# Patient Record
Sex: Male | Born: 1945
Health system: Southern US, Community
[De-identification: ages and names within clinical notes are randomized; demographics above are authoritative.]

## PROBLEM LIST (undated history)

## (undated) DIAGNOSIS — E785 Hyperlipidemia, unspecified: Secondary | ICD-10-CM

## (undated) DIAGNOSIS — Z87442 Personal history of urinary calculi: Secondary | ICD-10-CM

## (undated) DIAGNOSIS — I10 Essential (primary) hypertension: Secondary | ICD-10-CM

## (undated) DIAGNOSIS — S065X9A Traumatic subdural hemorrhage with loss of consciousness of unspecified duration, initial encounter: Secondary | ICD-10-CM

## (undated) DIAGNOSIS — I2510 Atherosclerotic heart disease of native coronary artery without angina pectoris: Secondary | ICD-10-CM

## (undated) DIAGNOSIS — I219 Acute myocardial infarction, unspecified: Secondary | ICD-10-CM

## (undated) HISTORY — DX: Hyperlipidemia, unspecified: E78.5

## (undated) HISTORY — DX: Atherosclerotic heart disease of native coronary artery without angina pectoris: I25.10

## (undated) HISTORY — PX: CHOLECYSTECTOMY OPEN: SUR202

## (undated) HISTORY — PX: APPENDECTOMY: SHX54

---

## 1991-06-11 DIAGNOSIS — I219 Acute myocardial infarction, unspecified: Secondary | ICD-10-CM

## 1991-06-11 HISTORY — DX: Acute myocardial infarction, unspecified: I21.9

## 1991-06-11 HISTORY — PX: CORONARY ANGIOPLASTY WITH STENT PLACEMENT: SHX49

## 2001-06-22 ENCOUNTER — Encounter: Payer: Self-pay | Admitting: Emergency Medicine

## 2001-06-22 ENCOUNTER — Inpatient Hospital Stay (HOSPITAL_COMMUNITY): Admission: EM | Admit: 2001-06-22 | Discharge: 2001-06-26 | Payer: Self-pay | Admitting: Emergency Medicine

## 2001-06-22 ENCOUNTER — Encounter: Payer: Self-pay | Admitting: Cardiology

## 2001-06-23 ENCOUNTER — Encounter: Payer: Self-pay | Admitting: Cardiology

## 2001-06-25 ENCOUNTER — Encounter: Payer: Self-pay | Admitting: Emergency Medicine

## 2004-04-13 ENCOUNTER — Ambulatory Visit: Payer: Self-pay | Admitting: Cardiology

## 2004-05-08 ENCOUNTER — Ambulatory Visit: Payer: Self-pay | Admitting: Cardiology

## 2004-06-18 ENCOUNTER — Ambulatory Visit: Payer: Self-pay | Admitting: Cardiology

## 2004-08-20 ENCOUNTER — Ambulatory Visit: Payer: Self-pay | Admitting: Cardiology

## 2005-04-11 ENCOUNTER — Ambulatory Visit: Payer: Self-pay | Admitting: Cardiology

## 2006-05-19 ENCOUNTER — Ambulatory Visit: Payer: Self-pay | Admitting: Cardiology

## 2006-06-05 ENCOUNTER — Ambulatory Visit: Payer: Self-pay

## 2006-10-27 ENCOUNTER — Ambulatory Visit: Payer: Self-pay | Admitting: Cardiology

## 2006-10-27 LAB — CONVERTED CEMR LAB
ALT: 12 units/L (ref 0–40)
AST: 20 units/L (ref 0–37)
Albumin: 3.7 g/dL (ref 3.5–5.2)
Alkaline Phosphatase: 58 units/L (ref 39–117)
Bilirubin, Direct: 0.1 mg/dL (ref 0.0–0.3)
Cholesterol: 129 mg/dL (ref 0–200)
HDL: 37.2 mg/dL — ABNORMAL LOW (ref >39.0)
LDL Cholesterol: 73 mg/dL (ref 0–99)
Total Bilirubin: 0.7 mg/dL (ref 0.3–1.2)
Total CHOL/HDL Ratio: 3.5
Total Protein: 6.4 g/dL (ref 6.0–8.3)
Triglycerides: 93 mg/dL (ref 0–149)
VLDL: 19 mg/dL (ref 0–40)

## 2007-06-10 ENCOUNTER — Ambulatory Visit: Payer: Self-pay | Admitting: Cardiology

## 2007-06-17 ENCOUNTER — Ambulatory Visit: Payer: Self-pay | Admitting: Cardiology

## 2007-06-17 LAB — CONVERTED CEMR LAB
ALT: 15 units/L (ref 0–53)
AST: 23 units/L (ref 0–37)
Albumin: 4 g/dL (ref 3.5–5.2)
Alkaline Phosphatase: 67 units/L (ref 39–117)
BUN: 11 mg/dL (ref 6–23)
Basophils Absolute: 0 10*3/uL (ref 0.0–0.1)
Basophils Relative: 0.1 % (ref 0.0–1.0)
Bilirubin, Direct: 0.1 mg/dL (ref 0.0–0.3)
CO2: 29 meq/L (ref 19–32)
Calcium: 9.4 mg/dL (ref 8.4–10.5)
Chloride: 105 meq/L (ref 96–112)
Cholesterol: 144 mg/dL (ref 0–200)
Creatinine, Ser: 1.2 mg/dL (ref 0.4–1.5)
Eosinophils Absolute: 0.1 10*3/uL (ref 0.0–0.6)
Eosinophils Relative: 1.8 % (ref 0.0–5.0)
GFR calc Af Amer: 79 mL/min
GFR calc non Af Amer: 65 mL/min
Glucose, Bld: 105 mg/dL — ABNORMAL HIGH (ref 70–99)
HCT: 44.3 % (ref 39.0–52.0)
HDL: 36.8 mg/dL — ABNORMAL LOW (ref >39.0)
Hemoglobin: 15.4 g/dL (ref 13.0–17.0)
LDL Cholesterol: 85 mg/dL (ref 0–99)
Lymphocytes Relative: 26.5 % (ref 12.0–46.0)
MCHC: 34.8 g/dL (ref 30.0–36.0)
MCV: 90.2 fL (ref 78.0–100.0)
Monocytes Absolute: 0.4 10*3/uL (ref 0.2–0.7)
Monocytes Relative: 8.2 % (ref 3.0–11.0)
Neutro Abs: 3.5 10*3/uL (ref 1.4–7.7)
Neutrophils Relative %: 63.4 % (ref 43.0–77.0)
Platelets: 205 10*3/uL (ref 150–400)
Potassium: 4.5 meq/L (ref 3.5–5.1)
RBC: 4.91 M/uL (ref 4.22–5.81)
RDW: 11.8 % (ref 11.5–14.6)
Sodium: 141 meq/L (ref 135–145)
TSH: 2.92 microintl units/mL (ref 0.35–5.50)
Total Bilirubin: 0.8 mg/dL (ref 0.3–1.2)
Total CHOL/HDL Ratio: 3.9
Total Protein: 6.7 g/dL (ref 6.0–8.3)
Triglycerides: 109 mg/dL (ref 0–149)
VLDL: 22 mg/dL (ref 0–40)
WBC: 5.4 10*3/uL (ref 4.5–10.5)

## 2008-05-11 ENCOUNTER — Ambulatory Visit: Payer: Self-pay | Admitting: Cardiology

## 2008-05-18 ENCOUNTER — Ambulatory Visit: Payer: Self-pay | Admitting: Cardiology

## 2008-05-18 LAB — CONVERTED CEMR LAB
ALT: 15 units/L (ref 0–53)
AST: 21 units/L (ref 0–37)
Basophils Relative: 0.9 % (ref 0.0–3.0)
CO2: 30 meq/L (ref 19–32)
Calcium: 9.2 mg/dL (ref 8.4–10.5)
Chloride: 108 meq/L (ref 96–112)
Cholesterol: 124 mg/dL (ref 0–200)
Creatinine, Ser: 1.2 mg/dL (ref 0.4–1.5)
Eosinophils Relative: 2.3 % (ref 0.0–5.0)
Glucose, Bld: 111 mg/dL — ABNORMAL HIGH (ref 70–99)
Hemoglobin: 15.2 g/dL (ref 13.0–17.0)
LDL Cholesterol: 66 mg/dL (ref 0–99)
Lymphocytes Relative: 27.6 % (ref 12.0–46.0)
Monocytes Relative: 8.1 % (ref 3.0–12.0)
Neutro Abs: 3.9 10*3/uL (ref 1.4–7.7)
Neutrophils Relative %: 61.1 % (ref 43.0–77.0)
RBC: 4.81 M/uL (ref 4.22–5.81)
Total CHOL/HDL Ratio: 3.9
Total Protein: 6.7 g/dL (ref 6.0–8.3)
WBC: 6.3 10*3/uL (ref 4.5–10.5)

## 2008-09-22 ENCOUNTER — Telehealth: Payer: Self-pay | Admitting: Cardiology

## 2009-05-11 DIAGNOSIS — I2510 Atherosclerotic heart disease of native coronary artery without angina pectoris: Secondary | ICD-10-CM

## 2009-05-11 DIAGNOSIS — E785 Hyperlipidemia, unspecified: Secondary | ICD-10-CM

## 2009-05-12 ENCOUNTER — Ambulatory Visit: Payer: Self-pay | Admitting: Cardiology

## 2009-05-23 ENCOUNTER — Ambulatory Visit: Payer: Self-pay | Admitting: Cardiology

## 2009-05-31 ENCOUNTER — Telehealth: Payer: Self-pay | Admitting: Cardiology

## 2009-06-06 ENCOUNTER — Encounter (INDEPENDENT_AMBULATORY_CARE_PROVIDER_SITE_OTHER): Payer: Self-pay | Admitting: *Deleted

## 2009-06-06 LAB — CONVERTED CEMR LAB
AST: 23 units/L (ref 0–37)
Albumin: 3.7 g/dL (ref 3.5–5.2)
Alkaline Phosphatase: 65 units/L (ref 39–117)
BUN: 11 mg/dL (ref 6–23)
Basophils Absolute: 0 10*3/uL (ref 0.0–0.1)
Bilirubin, Direct: 0.2 mg/dL (ref 0.0–0.3)
CO2: 29 meq/L (ref 19–32)
Calcium: 9.1 mg/dL (ref 8.4–10.5)
Chloride: 105 meq/L (ref 96–112)
Cholesterol: 112 mg/dL (ref 0–200)
Creatinine, Ser: 1.1 mg/dL (ref 0.4–1.5)
Eosinophils Absolute: 0.1 10*3/uL (ref 0.0–0.7)
LDL Cholesterol: 58 mg/dL (ref 0–99)
Lymphocytes Relative: 28.1 % (ref 12.0–46.0)
MCHC: 35.1 g/dL (ref 30.0–36.0)
MCV: 92.1 fL (ref 78.0–100.0)
Monocytes Absolute: 0.5 10*3/uL (ref 0.1–1.0)
Neutrophils Relative %: 62.5 % (ref 43.0–77.0)
Platelets: 188 10*3/uL (ref 150.0–400.0)
RBC: 4.67 M/uL (ref 4.22–5.81)
Total CHOL/HDL Ratio: 4
Total Protein: 6.7 g/dL (ref 6.0–8.3)
Triglycerides: 116 mg/dL (ref 0.0–149.0)

## 2009-07-06 ENCOUNTER — Telehealth: Payer: Self-pay | Admitting: Cardiology

## 2009-10-24 ENCOUNTER — Telehealth: Payer: Self-pay | Admitting: Cardiology

## 2010-02-05 ENCOUNTER — Telehealth: Payer: Self-pay | Admitting: Cardiology

## 2010-02-09 ENCOUNTER — Encounter (INDEPENDENT_AMBULATORY_CARE_PROVIDER_SITE_OTHER): Payer: Self-pay | Admitting: *Deleted

## 2010-03-21 ENCOUNTER — Telehealth: Payer: Self-pay | Admitting: Cardiology

## 2010-05-21 ENCOUNTER — Encounter: Payer: Self-pay | Admitting: Cardiology

## 2010-05-21 ENCOUNTER — Ambulatory Visit: Payer: Self-pay | Admitting: Cardiology

## 2010-05-21 DIAGNOSIS — R079 Chest pain, unspecified: Secondary | ICD-10-CM

## 2010-05-29 ENCOUNTER — Telehealth (INDEPENDENT_AMBULATORY_CARE_PROVIDER_SITE_OTHER): Payer: Self-pay | Admitting: *Deleted

## 2010-05-30 ENCOUNTER — Encounter: Payer: Self-pay | Admitting: Internal Medicine

## 2010-05-30 ENCOUNTER — Encounter (HOSPITAL_COMMUNITY)
Admission: RE | Admit: 2010-05-30 | Discharge: 2010-07-10 | Payer: Self-pay | Source: Home / Self Care | Attending: Cardiology | Admitting: Cardiology

## 2010-05-30 ENCOUNTER — Ambulatory Visit: Payer: Self-pay | Admitting: Cardiology

## 2010-05-30 ENCOUNTER — Ambulatory Visit: Payer: Self-pay

## 2010-06-06 LAB — CONVERTED CEMR LAB
ALT: 28 units/L (ref 0–53)
AST: 31 units/L (ref 0–37)
Albumin: 3.8 g/dL (ref 3.5–5.2)
Alkaline Phosphatase: 68 units/L (ref 39–117)
Basophils Absolute: 0 10*3/uL (ref 0.0–0.1)
Basophils Relative: 0.4 % (ref 0.0–3.0)
CO2: 33 meq/L — ABNORMAL HIGH (ref 19–32)
Calcium: 9.4 mg/dL (ref 8.4–10.5)
Cholesterol: 160 mg/dL (ref 0–200)
Creatinine, Ser: 1.1 mg/dL (ref 0.4–1.5)
Eosinophils Absolute: 0.1 10*3/uL (ref 0.0–0.7)
GFR calc non Af Amer: 69.95 mL/min (ref >60.00)
HDL: 40.1 mg/dL (ref >39.00)
Lymphocytes Relative: 24.8 % (ref 12.0–46.0)
MCHC: 34.7 g/dL (ref 30.0–36.0)
Monocytes Relative: 8.3 % (ref 3.0–12.0)
Neutrophils Relative %: 64.7 % (ref 43.0–77.0)
RBC: 4.78 M/uL (ref 4.22–5.81)
Sodium: 144 meq/L (ref 135–145)
Total CHOL/HDL Ratio: 4
Total Protein: 6.5 g/dL (ref 6.0–8.3)
Triglycerides: 133 mg/dL (ref 0.0–149.0)

## 2010-06-13 ENCOUNTER — Other Ambulatory Visit: Payer: Self-pay | Admitting: Cardiology

## 2010-06-13 ENCOUNTER — Ambulatory Visit: Admission: RE | Admit: 2010-06-13 | Discharge: 2010-06-13 | Payer: Self-pay | Source: Home / Self Care

## 2010-06-14 LAB — BASIC METABOLIC PANEL
BUN: 14 mg/dL (ref 6–23)
CO2: 27 mEq/L (ref 19–32)
Calcium: 9.4 mg/dL (ref 8.4–10.5)
Chloride: 106 mEq/L (ref 96–112)
Creatinine, Ser: 1.4 mg/dL (ref 0.4–1.5)
GFR: 55.43 mL/min — ABNORMAL LOW (ref >60.00)
Glucose, Bld: 98 mg/dL (ref 70–99)
Potassium: 4.4 mEq/L (ref 3.5–5.1)
Sodium: 144 mEq/L (ref 135–145)

## 2010-07-10 NOTE — Letter (Signed)
Summary: Generic Letter  Architectural technologist, Main Office  [address]   [place], [place] [address]   Phone: [phone]  Fax: [phone]        February 09, 2010 MRN: [id number]    Jeffrey Lyons [address] [place], [place]  [address]    Dear Jeffrey Lyons,  I have attempted to contact you at the home phone number we have on file. When calling, it states this number has been disconnected. I just wanted to let you know that I spoke with Dr. Juanda Chance about your cardizem. He said it is ok to take the short acting tablet once daily until you run out of your current suppley. After that, we will need to refill the cardizem cd 120mg  capsules for you. If you develop any chest pain prior to running out of your current prescription, let us know.        Sincerely,  Sherri Rad, RN, BSN  This letter has been electronically signed by your physician.

## 2010-07-10 NOTE — Progress Notes (Signed)
Summary: test result send to home address [facility])   Phone Note Call from Patient Call back at [facility] Phone [phone] Call back at [phone]   Caller: Patient Reason for Call: Talk to Nurse, Lab or Test Results Summary of Call: send copy of test result to home address.  Initial call taken by: Lorne Skeens,  March 21, 2010 1:26 PM  Follow-up for Phone Call        I attempted to call the pt. He has not had any recent testing in our office. His contact # states it is disconnected. I looked in [employer] and found a different contact #. I have left a message at [phone] to call.  Follow-up by: Sherri Rad, RN, BSN,  March 21, 2010 1:37 PM  Additional Follow-up for Phone Call Additional follow up Details #1::        Pt calling for test results Judie Grieve  March 23, 2010 10:17 AM  spoke w/pts wife would like copy of lab results mailed to him, advised last results were from Dec, he just needs them for his job/ins. will mail Meredith Staggers, RN  March 23, 2010 10:41 AM

## 2010-07-10 NOTE — Progress Notes (Signed)
Summary: pt needs nitro done daj  Medications Added METOPROLOL TARTRATE 50 MG TABS (METOPROLOL TARTRATE) Take one tablet by mouth twice a day NITROSTAT 0.4 MG SUBL (NITROGLYCERIN) 1 tablet under tongue at onset of chest pain; you may repeat every 5 minutes for up to 3 doses.       Phone Note Refill Request Call back at [phone] Message from:  [facility] [address]  Refills Requested: Medication #1:  Nitro tabs Initial call taken by: Omer Jack,  Oct 24, 2009 2:36 PM    New/Updated Medications: METOPROLOL TARTRATE 50 MG TABS (METOPROLOL TARTRATE) Take one tablet by mouth twice a day NITROSTAT 0.4 MG SUBL (NITROGLYCERIN) 1 tablet under tongue at onset of chest pain; you may repeat every 5 minutes for up to 3 doses. Prescriptions: NITROSTAT 0.4 MG SUBL (NITROGLYCERIN) 1 tablet under tongue at onset of chest pain; you may repeat every 5 minutes for up to 3 doses.  #25 x 4   Entered by:   Burnett Kanaris, CNA   Authorized by:   Lenoria Farrier, MD, [facility]   Signed by:   Burnett Kanaris, CNA on 10/25/2009   Method used:   [facility]        [facility]  [address] [phone][facility] (retail[facility]       [address]       [place], [place]  [address]       Ph: [phone]       Fax: [phone]   RxID:   [id number]

## 2010-07-10 NOTE — Progress Notes (Signed)
Summary: refill meds   Phone Note Refill Request Call back at Home Phone [phone] Message from:  Patient on July 06, 2009 9:00 AM  Refills Requested: Medication #1:  ISOSORBIDE MONONITRATE CR 60 MG XR24H-TAB Take one-half  tablet by mouth daily [facility] [phone] / ref # [id number]   Method Requested: Fax to Mail Away Pharmacy Initial call taken by: Lorne Skeens,  July 06, 2009 9:01 AM  Follow-up for Phone Call        [facility] x 3 Follow-up by: Oswald Hillock,  July 06, 2009 10:15 AM    Prescriptions: ISOSORBIDE MONONITRATE CR 60 MG XR24H-TAB (ISOSORBIDE MONONITRATE) Take one-half  tablet by mouth daily  #90 x 3   Entered by:   Oswald Hillock   Authorized by:   Lenoria Farrier, MD, [facility]   Signed by:   Oswald Hillock on 07/06/2009   Method used:   Faxed to ...       [facility] Environmental education officer)       [address] [address]       [place], [place]  [address]       Ph: [phone]       Fax: [phone]   RxID:   [id number]

## 2010-07-10 NOTE — Progress Notes (Signed)
Summary: pt has medication question  Medications Added CARDIZEM CD 120 MG XR24H-CAP (DILTIAZEM HCL COATED BEADS) take one capsule by mouth once daily       Phone Note Call from Patient Call back at Home Phone [phone]   Caller: Patient Reason for Call: Talk to Nurse, Talk to Doctor Summary of Call: pt has a question cardiazem 120 Initial call taken by: Omer Jack,  February 05, 2010 2:19 PM  Follow-up for Phone Call        I spoke with the pt. He states he has always gotten cardizem cd in capsule form. His last rx was sent in for plain cardizem 120mg  tablets. He was told by his mail order pharmacy that the tabs are short acting. The pharmacy will not take this back. He is out of the capsules. He will go ahead and start the tabs at once daily. I explained I will talk with Dr. Juanda Chance to see if we need to increase the times of day he is taking this. I will call him back after reviewing with Dr. Juanda Chance. He is agreeable. Follow-up by: Sherri Rad, RN, BSN,  February 05, 2010 2:27 PM  Additional Follow-up for Phone Call Additional follow up Details #1::        Per Dr. Juanda Chance, the pt can take the short acting once daily until he has completed this. He then needs to get the long acting dose filled (cardizem cd 120mg  once daily). If he has increased angina, he will need to let us know. I attempted to call the pt at his home #. Message states his phone has been d/c'ed. I will mail a letter to the pt.  Additional Follow-up by: Sherri Rad, RN, BSN,  February 09, 2010 5:22 PM    New/Updated Medications: CARDIZEM CD 120 MG XR24H-CAP (DILTIAZEM HCL COATED BEADS) take one capsule by mouth once daily

## 2010-07-12 NOTE — Assessment & Plan Note (Signed)
Summary: G6Y      Allergies Added: NKDA  Visit Type:  Follow-up  CC:  no complaints.  History of Present Illness: The patient is 65 years old and returns for management of CAD. He had a remote anterior MI and his last catheterization was in 2003 at which time he had total occlusion of the RCA. His ejection fraction was 51%.   He returns for follow up.  He developed bronchitic like symptoms in early Nov.  He seems to be slowly getting over this.  He has noticed substernal tightness when he lifts heavy objects at his job at [employer].  He started noticing this 6 mos ago.  He denies any increase in symptoms.  He has some assoc dyspnea.  No arm or jaw pain.  No assoc diaph or nausea.  No synocpe.  No orhtopnea, PND or palpitations.    Current Medications (verified): 1)  Cardizem Cd 120 Mg Xr24h-Cap (Diltiazem Hcl Coated Beads) .... Take One Capsule By Mouth Once Daily 2)  Metoprolol Tartrate 50 Mg Tabs (Metoprolol Tartrate) .... Take One Tablet By Mouth Twice A Day 3)  Isosorbide Mononitrate Cr 60 Mg Xr24h-Tab (Isosorbide Mononitrate) .... Take One-Half  Tablet By Mouth Daily 4)  Aspirin 81 Mg Tbec (Aspirin) .... Take One Tablet By Mouth Daily 5)  Simvastatin 40 Mg Tabs (Simvastatin) .... Take One Tablet By Mouth Daily At Bedtime 6)  Nitrostat 0.4 Mg Subl (Nitroglycerin) .Marland Kitchen.. 1 Tablet Under Tongue At Onset of Chest Pain; You May Repeat Every 5 Minutes For Up To 3 Doses.  Allergies (verified): No Known Drug Allergies  Past History:  Past Medical History: Last updated: 05/11/2009 1. Coronary artery disease, status post remote anterior wall     myocardial infarction with total RCA at cath 2003 2. Good left ventricular function with EF 51% 3. Hyperlipidemia.   Review of Systems       As per  the HPI.  All other systems reviewed and negative.   Vital Signs:  Patient profile:   65 year old male Height:      71 inches Weight:      183 pounds BMI:     25.62 Pulse rate:   68 /  minute Pulse rhythm:   regular BP sitting:   128 / 74  (right arm)  Vitals Entered By: Jacquelin Hawking, CMA (May 21, 2010 2:18 PM)  Physical Exam  General:  Well nourished, well developed, in no acute distress HEENT: normal Neck: no JVD Cardiac:  normal S1, S2; RRR; no murmur Lungs:  clear to auscultation bilaterally, no wheezing, rhonchi or rales Abd: soft, nontender, no hepatomegaly Ext: no edema Vascular: no carotid  bruits; DP and PT 2+ bilat. Skin: warm and dry Neuro:  CNs 2-12 intact, no focal abnormalities noted    EKG  Procedure date:  05/21/2010  Findings:      Normal sinus rhythm with rate of:  60 PRWP NSSTTW changes   Impression & Recommendations:  Problem # 1:  CAD, NATIVE VESSEL (ICD-414.01)  He is having some symptoms concerning for angina. He has a known totally occluded RCA and moderate disease in the LAD by cath in 2003. We will arrange stress myoview and plan follow up with Dr. Sanjuana Kava.  Orders: Nuclear Stress Test (Nuc Stress Test) EKG w/ Interpretation (93000)  Problem # 2:  HYPERLIPIDEMIA-MIXED (ICD-272.4)  Arrange FLP and LFTs.  His updated medication list for this problem includes:    Simvastatin 40 Mg Tabs (Simvastatin) .Marland Kitchen... Take  one tablet by mouth daily at bedtime  Orders: Nuclear Stress Test (Nuc Stress Test) EKG w/ Interpretation (93000)  Problem # 3:  CHEST PAIN UNSPECIFIED (ICD-786.50)  As above, will arrange stress myoview.  Orders: Nuclear Stress Test (Nuc Stress Test) EKG w/ Interpretation (93000)  Patient Instructions: 1)  Your physician recommends that you return for lab work in: at time of stress test . . .fasting lipids, LFTs, BMET, CBC, TSH (Dx 272.4). 2)  Your physician has requested that you have an exercise stress myoview.  For further information please visit [email or web address]  Please follow instruction sheet, as given. 3)  Your physician wants you to follow-up in: 1 year with Dr. Clifton James. You will  receive a reminder letter in the mail two months in advance. If you don't receive a letter, please call our office to schedule the follow-up appointment. 4)  Your physician recommends that you continue on your current medications as directed. Please refer to the Current Medication list given to you today.

## 2010-07-12 NOTE — Assessment & Plan Note (Signed)
Summary: Cardiology Nuclear Testing  Nuclear Med Background Indications for Stress Test: Evaluation for Ischemia   History: Heart Catheterization, Myocardial Infarction, Myocardial Perfusion Study, Stents   Symptoms: Chest Pain, Chest Tightness with Exertion    Nuclear Pre-Procedure Cardiac Risk Factors: Lipids Caffeine/Decaff Intake: None NPO After: 7:00 PM Lungs: clear IV 0.9% NS with Angio Cath: 22g     IV Site: R Antecubital IV Started by: Irean Hong, RN Chest Size (in) 40     Height (in): 71 Weight (lb): 184 BMI: 25.76 Tech Comments: Held metoprolol 24 hrs.    Nuclear Med Study 1 or 2 day study:  1 day     Stress Test Type:  Stress Reading MD:  Arvilla Meres, MD     Referring MD:  B.Brodie Resting Radionuclide:  Technetium 56m Tetrofosmin     Resting Radionuclide Dose:  11 mCi  Stress Radionuclide:  Technetium 42m Tetrofosmin     Stress Radionuclide Dose:  32.9 mCi   Stress Protocol Exercise Time (min):  9:14 min     Max HR:  141 bpm     Predicted Max HR:  156 bpm  Max Systolic BP: 197 mm Hg     Percent Max HR:  90.38 %     METS: 10.4 Rate Pressure Product:  75643    Stress Test Technologist:  Milana Na, EMT-P     Nuclear Technologist:  Doyne Keel, CNMT  Rest Procedure  Myocardial perfusion imaging was performed at rest 45 minutes following the intravenous administration of Technetium 42m Tetrofosmin.  Stress Procedure  The patient exercised for  9:14. The patient stopped due to fatigue and denied any chest pain.  There were no significant ST-T wave changes.  Technetium 61m Tetrofosmin was injected at peak exercise and myocardial perfusion imaging was performed after a brief delay.  QPS Raw Data Images:  Normal; no motion artifact; normal heart/lung ratio. Stress Images:  Decreased uptake in the mid to distal anterior wall Rest Images:  Decreased uptake in the mid to distal anterior wall Subtraction (SDS):  Previous infarct in the mid to distal  anterior wall. No ischemia Transient Ischemic Dilatation:  1.02  (Normal <1.22)  Lung/Heart Ratio:  0.40  (Normal <0.45)  Quantitative Gated Spect Images QGS EDV:  115 ml QGS ESV:  53 ml QGS EF:  54 % QGS cine images:  Hypokinesis of distal anterior wall and apex.  Findings Low risk nuclear study      Overall Impression  Exercise Capacity: Good exercise capacity. BP Response: Normal blood pressure response. Clinical Symptoms: No chest pain ECG Impression: 0.5-1.0 mm ST depression in the inferior and lateral leads at peak (borderline +) Overall Impression: Low risk stress nuclear study. Overall Impression Comments: Previous infarct in the mid to distal anterior wall. No ischemia. No change from previous  test 06/05/2006.  Appended Document: Cardiology Nuclear Testing [facility].  Appended Document: Cardiology Nuclear Testing The pt is aware of his results.

## 2010-07-12 NOTE — Progress Notes (Signed)
Summary: Nuclear Pre-Procedure  Phone Note Outgoing Call Call back at [facility] Phone [phone]   Call placed by: Stanton Kidney, EMT-P,  May 29, 2010 12:42 PM Action Taken: Phone Call Completed Summary of Call: Left message with information on Myoview Information Sheet (see scanned document for details). Stanton Kidney, EMT-P  May 29, 2010 12:42 PM     Nuclear Med Background Indications for Stress Test: Evaluation for Ischemia   History: Heart Catheterization, Myocardial Infarction, Myocardial Perfusion Study, Stents   Symptoms: Chest Pain, Chest Tightness with Exertion    Nuclear Pre-Procedure Cardiac Risk Factors: Lipids Height (in): 71

## 2010-10-23 NOTE — Assessment & Plan Note (Signed)
[facility]                            CARDIOLOGY OFFICE NOTE   NAME:Jeffrey Lyons, Jeffrey Lyons                       MRN:          [id number]  DATE:05/11/2008                            DOB:          26-Sep-1945    PRIMARY CARE PHYSICIAN:  None.   CLINICAL HISTORY:  Pius Byrom is 65 years old, returned for followup  management of his coronary heart disease.  He has had a remote anterior  wall infarction, and at last catheterization in 2003, he had total  occlusion of the right coronary __________ LAD with good LV function.  His last Myoview scan was in 2007, at which time he had an ejection  fraction of 51% and had an apical scar, but no ischemia.  He says he has  been doing quite well, has had no recent chest pain, shortness of  breath, or palpitations.   PAST MEDICAL HISTORY:  Significant for hyperlipidemia.   CURRENT MEDICATIONS:  1. Imdur 60 mg one-half tablet daily.  2. Lopressor 50 mg b.i.d.  3. Cardizem 120 mg daily.  4. Aspirin 81 mg daily.  5. Simvastatin 40 mg nightly.   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  He does not smoke.   PHYSICAL EXAMINATION:  VITAL SIGNS:  The blood pressure was 126/75 and  the pulse 60 and regular.  NECK:  There was no vein distention.  The carotid pulses were full  without bruits.  CHEST:  Clear.  CARDIAC:  Rhythm was regular.  There are no murmurs or gallops.  ABDOMEN:  Soft with normal bowel sounds.  There is no  hepatosplenomegaly.  EXTREMITIES:  Peripheral pulses are full with no peripheral edema.   Electrocardiogram was normal.   IMPRESSION:  1. Coronary artery disease, status post remote anterior wall      myocardial infarction with coronary anatomy, as described above.  2. Good left ventricular function.  3. Hyperlipidemia.   RECOMMENDATIONS:  I think, Mr. Tarte is doing quite well.  We will plan  to get fasting lipid and liver, CBC,  and BMP.   PLAN:  I plan to see him back in followup in a year.     Bruce Elvera Lennox Juanda Chance, MD, [facility]  Electronically Signed    BRB/MedQ  DD: 05/11/2008  DT: 05/12/2008  Job #: [id number]

## 2010-10-23 NOTE — Assessment & Plan Note (Signed)
[facility]                            CARDIOLOGY OFFICE NOTE   NAME:Jeffrey Lyons, Jeffrey Lyons                       MRN:          [id number]  DATE:06/10/2007                            DOB:          [birth year]    PRIMARY CARE PHYSICIAN:  None   HISTORY OF PRESENT ILLNESS:  Jeffrey Lyons returned for follow-up  management of his coronary heart disease.  He is 65 years old.  He has  had a remote anterior wall myocardial infarction and at last  catheterization in 2003 he had total occlusion of right coronary and 70%  narrowing in the LAD with good overall LV function.  We did a Myoview  scan last year and he had some anterior scarring, but no evidence of  ischemia.   He says he has done quite well over the past year with no chest pain,  shortness breath or palpitations.  He is working about 6 hours a day at  [employer] out on [employer].  He does not lift more than 40 pounds.   PAST MEDICAL HISTORY:  Significant for hyperlipidemia.   CURRENT MEDICATIONS:  1. Imdur.  2. Lopressor.  3. Cardizem.  4. Aspirin.  5. Simvastatin.   PHYSICAL EXAMINATION:  The blood pressure is 129/74 and pulse 67 and  regular.  There is no venous distension.  Carotid pulses are full without bruits.  CHEST:  Clear.  CARDIAC:  Rhythm is regular.  There are no murmurs or gallops.  ABDOMEN: Was soft without organomegaly.  Peripheral pulses were full and there was no peripheral edema.   An EKG showed a small Q wave in V1 and V2.   IMPRESSION:  1. Coronary artery status post remote antral wall myocardial      infarction with coronary anatomy as described above, now stable.  2. Good left ventricular function.  3. Hyperlipidemia.   RECOMMENDATIONS:  Jeffrey Lyons is doing quite well.  His last lipid  profile is good except for slightly low HDL.  I talked about eliminating  bad carbohydrates.  Will have him come in for a lipid and liver, CBC,  BMP and TSH next week.  I encouraged him to  make an appointment to see  Dr.  Bradd Canary for establishment for primary care.  Will plan to see him  back in follow-up in the year.     Bruce Elvera Lennox Juanda Chance, MD, [facility]  Electronically Signed    BRB/MedQ  DD: 06/10/2007  DT: 06/10/2007  Job #: [id number]

## 2010-10-26 NOTE — Discharge Summary (Signed)
[place]  Patient:    Jeffrey Lyons, Jeffrey Lyons Visit Number: [id number] MRN: [id number]          Service Type: MED Location: [phone] Attending Physician:  Lenoria Farrier Dictated by:   Chinita Pester, C.R.N.P. Admit Date:  06/22/2001 Discharge Date: 06/26/2001   CC:         Duffy Rhody C. Andrey Campanile, M.D.                           Discharge Summary  PRIMARY DIAGNOSIS:  Presyncope.  HISTORY OF PRESENT ILLNESS:  This is a 65 year old gentleman with a history of CAD, status post MI in 21.  He was treated with a PCI of the LAD with restenosis in 1994 and also treated with a PCI and stent in 1995 with total RCA filled by collaterals, who presents with a complaint of presyncope the day of admission while he was driving home from work.  He states he got weak, says his vision went black.  He pulled over and was better in a matter of seconds. He never lost consciousness.  He developed mild 1-2/10 chest pressure and shortness of breath.  He denies diaphoresis or nausea or radiating symptoms. He feels better at the time of admission with the exception for chest pain. He never had symptoms like this before, but does have weakness and dizziness after his stent in 1995, not chest pain.  PAST MEDICAL HISTORY:  Positive for CAD, increased lipids, status post cholecystectomy and appendectomy.  HOSPITAL COURSE:  The patient was admitted.  He underwent  a cardiac catheterization  which showed left main normal, LAD proximal diffuse 40%, proximal focal 70%, circumflex proximal 30%, distal 20%, RCA occluded, right to right, left to right collaterals with an EF of 60%.  A two-dimensional echocardiogram was also performed which showed normal LV function.  During hospitalization, he had a consult with neurology who obtained an MRI.  MRI was subsequently negative.  He also had a consult with EP and EP study was obtained and also was negative for inducable ventricular  tachycardia.  Other laboratory data, on January 16, his sodium was 146, potassium 4.7, chloride 107, CO2 31, glucose 115, BUN 11, creatinine 1.2.  WBC was 6.8, H&H was 15 and 42.4 with platelet count of 214.  The patient was discharged later the same day of his EP study after completing his bed rest.  He was discharged on Lopressor 50 mg twice a day, Cardizem CD 120 daily, Zocor 10 each evening, coated aspirin daily, Imdur 30 1/2 tablet daily, and nitroglycerin as needed p.r.n.  DISCHARGE INSTRUCTIONS:  The patient was instructed not to do any heavy lifting or strenuous activity for the next four days and no driving for at least six weeks.  Low fat, low cholesterol, low salt diet.  He was to call if he developed a lump or any drainage in his groin.  FOLLOW-UP:  He is to follow up with Bruce R. Juanda Chance, M.D. [facility] on February 18, at 2:15 p.m. and at that time, he would be made aware of when he would be able to drive.  He is to follow up with Dr. Andrey Campanile and Dr. Anne Hahn as needed and as previously scheduled.  The patient was allowed to return to work next week with the restrictions of no driving and to avoid any activities where he would fall and hurt himself such as being on a ladder or high  places. Dictated by:   Chinita Pester, C.R.N.P. Attending Physician:  Lenoria Farrier DD:  06/26/01 TD:  06/29/01 Job: [id number]

## 2010-10-26 NOTE — Consult Note (Signed)
[place]  Patient:    LINAS, STEPTER Visit Number: [id number] MRN: [id number]          Service Type: MED Location: [phone] Attending Physician:  Lenoria Farrier Dictated by:   Marlan Palau, M.D. Proc. Date: 06/23/01 Admit Date:  06/22/2001   CC:         Bruce R. Juanda Chance, M.D. [facility]  [facility], [address]   Consultation Report  HISTORY OF PRESENT ILLNESS:  Keisean L. Kattner is a 65 year old white male born 1946-01-17, with a history of coronary artery disease, hypertension in the past.  This patient was apparently driving to work when he had an episode of visual dimming and loss that lasted 3 to 4 minutes with full clearing.  The patient denied any numbness on the arms, face, or legs.  He did have some generalized weakness, did have lightheaded, floating sensations.  No vertigo. The patient denies any double vision and denies any actual loss of consciousness.  The patient was able to contact EMS.  By the time he was check, blood pressure was 170/100.  The patient was brought to the hospital for an evaluation.  The patient is currently undergoing a cardiac workup, and neurology was called for further evaluation.  PAST MEDICAL HISTORY: 1. History of near syncope as above. 2. History of coronary artery disease. 3. History of hyperlipidemia. 4. History of gallbladder resection. 5. History of appendectomy. 6. Hypertension.  MEDICATIONS PRIOR TO ADMISSION: 1. Lopressor 50 mg b.i.d. 2. Cardizem 120 mg daily. 3. Zocor 10 mg a day. 4. Aspirin 81 mg a day. 5. Imdur 30 mg 1/2 tablet daily. 6. Sublingual nitroglycerin if needed.  ALLERGIES:  No known allergies.  HABITS:  The patient currently does not smoke or drink.  SOCIAL HISTORY:  The patient works at [employer] and lives in the [place] area.  He is married.  FAMILY MEDICAL HISTORY:  Notable for heart disease in a  sister.  The patient has had a brother with stroke.  REVIEW OF SYSTEMS:  Notable for no fevers or chills.  The patient denies headache, neck pains, chest pain, palpitations of the heart, shortness of breath during the above episode.  He denies any focal numbness or weakness on the arms or legs.  He did have generalized weakness.  The patient had difficulty walking due to "weak legs" in the event.  PHYSICAL EXAMINATION:  VITAL SIGNS:  Blood pressure 122/74, heart rate 64, respiratory rate 20, temperature afebrile.  GENERAL:  The patient is a fairly well-developed white male who is alert and cooperative at the time of examination.  HEENT:  Head is atraumatic.  Eyes: Pupils are equal, round and reactive to light.  Disks are flat bilaterally.  NECK:  Supple.  No carotid bruits noted.  RESPIRATORY:  Clear.  CARDIOVASCULAR:  Regular rate and rhythm without obvious murmurs or rubs noted.  EXTREMITIES:  Without significant edema.  NEUROLOGIC:  Cranial nerves as above.  Facial symmetry is present. The patient has good sensation to face to pinprick and soft touch bilaterally.  The patient has good strength to facial muscles and muscles to head turning and shoulder shrug bilaterally.  Speech is well enunciated and not aphasic.  Motor testing reveals 5/5 strength in all fours.  Good symmetric motor tone is noted throughout.  Sensory testing is intact to pinprick, soft touch, vibratory sensation throughout.  Finger-nose-finger and toe-to-finger are symmetric.  Normal gait,  normal tandem gait.  Romberg negative with no evidence of pronator drift seen.  Deep tendon reflexes symmetric and normal. Toes downgoing bilaterally.  LABORATORY VALUES:  Notable for white count of 6.4, hemoglobin 15.2, hematocrit 43.7, MCV 89.1, platelets 211.  Sodium 145, potassium 4.1, chloride 106, CO2 31, glucose 110, BUN 10, creatinine 1.2, calcium 9.4, total protein 6.8, albumin 3.8, AST 26, ALT 23, alkaline  phosphatase 80, total bilirubin 0.7.  TSH is pending.  IMPRESSION: 1. Near syncopal event as above. 2. History of coronary artery disease. 3. History of hypertension.  This patient has had an episode described as some transient visual dimming, lightheaded sensations.  This event is consistent with an episode that resulted in hypotension.  It is unlikely that the above event represented transient ischemic attack type of event.  The patient did not lose consciousness, did not get confused.  The cause of the transient hypotension episode is not clear.  The patient is undergoing cardiac workup.  Although a transient ischemic attack is not likely, this is still possible and may consider an MR angiogram and MRI of the brain of the cardiac workup is negative.  PLAN: 1. Will follow patient while in house. 2. May obtain MRI as an outpatient if cardiac workup is negative. Dictated by:   Marlan Palau, M.D. Attending Physician:  Lenoria Farrier DD:  06/23/01 TD:  06/24/01 Job: [id number]

## 2010-10-26 NOTE — Cardiovascular Report (Signed)
[place]. [facility]  Patient:    BRAXDEN, LOVERING Visit Number: [id number] MRN: [id number]          Service Type: MED Location: [phone] Attending Physician:  Lenoria Farrier Dictated by:   Lewayne Bunting, M.D. [facility] Admit Date:  06/22/2001   CC:         Duffy Rhody C. Andrey Campanile, M.D.  Bruce Elvera Lennox Juanda Chance, M.D. [facility]   Cardiac Catheterization  DATE OF BIRTH:  August 21, 1945.  REFERRING PHYSICIAN:  Duffy Rhody C. Andrey Campanile, M.D.  CARDIOLOGISTEverardo Beals Juanda Chance, M.D.  PROCEDURE PERFORMED: 1. Left heart catheterization with selective coronary angiography. 2. Ventriculography.  DIAGNOSES: 1. Single-vessel coronary artery disease. 2. Normal left ventricular systolic function.  INDICATIONS:  The patient is a 65 year old white male with risk factors for coronary artery disease and known coronary artery disease.  The patient is status post a prior percutaneous coronary intervention to the LAD several years ago by Dr. Juanda Chance.  The patient has now been admitted with recurrent substernal chest pain.  He has ruled out for myocardial infarction.  He is being referred for a diagnostic catheterization to assess his coronary anatomy.  DESCRIPTION OF PROCEDURE:  After informed consent was obtained, the patient was brought to the catheterization laboratory.  The right groin was sterilely prepped and draped.  Lidocaine, 1%, was infiltrated and a #6 [place] arterial sheath was placed using the modified Seldinger technique.  The #6 [place] and JR4 catheters were then used to engage the left and right coronary ostia, respectively.  Selective coronary angiography was performed in various projections using manual injections of contrast.  After coronary angiography, a #6 French angled pigtail catheter was placed in the left ventricular cavity and appropriate left-sided hemodynamics were obtained.  Ventriculography was performed in a single plane RAO projection using power  injection of contrast. At the termination of the procedure, all catheters and sheaths were removed and the patient was brought back to the holding area.  No complications were noted and adequate hemostasis was provided.  FINDINGS:  Left ventricular pressure 142/15 mmHg.  Aortic pressure 142/79 mmHg.  Ventriculography:  Ejection fraction 60%.  There was a small area of basal inferior mild hypokinesis; otherwise, normal wall motion.  No mitral regurgitation.  Selective coronary angiography: 1. The left main coronary artery was a large caliber vessel with no evidence    of flow-limiting coronary artery disease. 2. The left anterior descending artery was a large caliber vessel wrapping    around the apex.  The LAD had a proximal diffuse 40% stenosis just prior to    the takeoff of a first diagonal branch which was free of flow-limiting    disease.  There was then a tandem lesion slightly more distal of    approximately 70% just prior to the first septal perforator.  The    remainder of the LAD was free of flow-limiting coronary artery disease    as well as the second and third diagonal branch which were moderately sized    vessels. 3. The circumflex coronary artery was a large caliber vessel which    demonstrated diffuse stenosis of approximately 30% in the proximal segment    as well as approximately 30% stenosis in a large second obtuse marginal    branch. 4. The right coronary artery was occluded proximally with right-to-right    collaterals filling the distal RCA.  Of note, the distal RCA filled from    left-to-left collaterals originating from  the circumflex coronary artery.  RECOMMENDATIONS:  Angiographic images were reviewed with Dr. Juanda Chance.  Compared to patients angiographic images from several years ago, there has been very little change in the anatomy of the LAD lesion.  The patient has a known occluded RCA with collateral circulation.  Of note also is that the patient did  not have chest pain leading up to this procedure but rather presented with presyncope.  The day prior, he had a Cardiolite study which showed no ischemia in the left anterior descending artery distribution.  Based on these findings and information, the decision was made to continue to treat the patient medically.  MRA and MRI have been ordered to further evaluate the etiology of his syncope as well as an evaluation by EP or possible implantable loop monitor.   Dictated by:   Lewayne Bunting, M.D. [facility] Attending Physician:  Lenoria Farrier DD:  06/24/01 TD:  06/24/01 Job: [id number]

## 2010-10-26 NOTE — Assessment & Plan Note (Signed)
[facility]                            CARDIOLOGY OFFICE NOTE   NAME:Jeffrey Lyons, Jeffrey Lyons                       MRN:          [id number]  DATE:05/19/2006                            DOB:          10/05/1945    PRIMARY CARE PHYSICIAN:  [place]ale [place]. Andrey Campanile, M.D.   Jeffrey Lyons is 65 years old and had a remote anterior wall infarction and  was last catheterized in 2003 at which time he had total occlusion in  the right coronary artery and 70% stenosis in the anterior descending  artery with normal LV function. He has had some exertional chest pain  since that time but overall has done quite well. With his recent level  of activity, he said he has not had any recent chest pain. He working  about six and a half hours a day at [employer]   His past medical history is significant for hyperlipidemia.   His current medications include Lopressor, Imdur, Cardizem, aspirin and  Vytorin.   On examination today, blood pressure is 115/70 with a pulse of 60 and  regular.  There was no venous distention. Carotid pulses were full without bruits.  CHEST:  Was clear.  CARDIAC:  Rhythm was regular. I could hear no murmurs or gallops.  The abdomen was soft without organomegaly.  Peripheral pulses were full and no peripheral edema.   Electrocardiogram was normal.   IMPRESSION:  1. Coronary artery disease status post remote anterior wall myocardial      infarction with coronary anatomy as described above.  2. Normal left ventricular dysfunction.  3. Hyperlipidemia.  4. Chronic angina.   RECOMMENDATIONS:  I think Jeffrey Lyons is doing quite well. He is not had  functional evaluation in some time, and his last evaluation was in 2003.  We will plan to obtain a rest stress Myoview on him. He does have angina  with moderate activity. Will also get a fasting lipid, liver, CBC and  BMP. We will follow these studies and if normal see him back in a year.     Bruce Elvera Lennox Juanda Chance, MD,  [facility]  Electronically Signed    BRB/MedQ  DD: 05/19/2006  DT: 05/20/2006  Job #: [id number]

## 2010-11-02 ENCOUNTER — Telehealth: Payer: Self-pay | Admitting: Cardiovascular Disease

## 2010-11-02 NOTE — Telephone Encounter (Signed)
Refill isosbiled [facility] mg[facility] Fax # [phone]

## 2010-11-02 NOTE — Telephone Encounter (Signed)
LMOM for pt to call back to verify isosorbide dose.

## 2010-11-12 MED ORDER — ISOSORBIDE MONONITRATE ER 60 MG PO TB24: 60.0000 mg | ORAL_TABLET | ORAL | Status: DC

## 2010-11-12 NOTE — Telephone Encounter (Signed)
Spoke with pt wife, rx is 60mg  1/2 po daily. RX sent into pharmacy.

## 2010-11-20 ENCOUNTER — Telehealth: Payer: Self-pay | Admitting: Cardiovascular Disease

## 2010-11-20 NOTE — Telephone Encounter (Signed)
Isosorbide 60 mg takes 1/2 tab a day, pharmacy needs more info on dosage [facility]

## 2010-12-03 ENCOUNTER — Telehealth: Payer: Self-pay | Admitting: Cardiovascular Disease

## 2010-12-03 MED ORDER — ISOSORBIDE MONONITRATE ER 60 MG PO TB24: ORAL_TABLET | ORAL | Status: DC

## 2010-12-03 NOTE — Telephone Encounter (Signed)
Pt needs refill on isorsorbide 60mg  half tab qd called [facility] on [address]

## 2010-12-03 NOTE — Telephone Encounter (Signed)
Pt needs [facility]orbide [facility]. Pt it needs to be called in to [address] in [place] to hold him over.

## 2010-12-10 ENCOUNTER — Telehealth: Payer: Self-pay | Admitting: Cardiovascular Disease

## 2010-12-10 NOTE — Telephone Encounter (Signed)
Pt pharmacy calling RX for imdur 60 mg, calling to see if it can be filled with generic. [phone]

## 2010-12-11 NOTE — Telephone Encounter (Signed)
Called to confirm generic okay.  Judithe Modest, CMA; no reference number included in phone note  Judithe Modest, CMA

## 2011-03-13 ENCOUNTER — Other Ambulatory Visit: Payer: Self-pay

## 2011-03-13 MED ORDER — METOPROLOL TARTRATE 50 MG PO TABS: 50.0000 mg | ORAL_TABLET | Freq: Two times a day (BID) | ORAL | Status: DC

## 2011-03-13 MED ORDER — SIMVASTATIN 40 MG PO TABS: 40.0000 mg | ORAL_TABLET | Freq: Every evening | ORAL | Status: DC

## 2011-03-14 ENCOUNTER — Telehealth: Payer: Self-pay | Admitting: *Deleted

## 2011-03-14 DIAGNOSIS — I2510 Atherosclerotic heart disease of native coronary artery without angina pectoris: Secondary | ICD-10-CM

## 2011-03-14 DIAGNOSIS — E785 Hyperlipidemia, unspecified: Secondary | ICD-10-CM

## 2011-03-14 MED ORDER — SIMVASTATIN 40 MG PO TABS: 40.0000 mg | ORAL_TABLET | Freq: Every evening | ORAL | Status: DC

## 2011-03-14 MED ORDER — METOPROLOL TARTRATE 50 MG PO TABS: 50.0000 mg | ORAL_TABLET | Freq: Two times a day (BID) | ORAL | Status: DC

## 2011-03-14 MED ORDER — DILTIAZEM HCL ER COATED BEADS 120 MG PO CP24: 120.0000 mg | ORAL_CAPSULE | Freq: Every day | ORAL | Status: DC

## 2011-03-14 NOTE — Telephone Encounter (Signed)
Paperwork received in office [facility]. Will fill for 90 days. Pt to see Dr. Clifton James in early December.

## 2011-04-23 NOTE — Telephone Encounter (Signed)
Spoke with pharmacist and no action needed at this time.  They have already filled with generic

## 2011-05-15 ENCOUNTER — Encounter: Payer: Self-pay | Admitting: Cardiovascular Disease

## 2011-05-16 ENCOUNTER — Encounter: Payer: Self-pay | Admitting: Cardiovascular Disease

## 2011-05-16 ENCOUNTER — Ambulatory Visit (INDEPENDENT_AMBULATORY_CARE_PROVIDER_SITE_OTHER): Payer: BC Managed Care – HMO | Admitting: Cardiovascular Disease

## 2011-05-16 VITALS — BP 119/72 | HR 58 | Ht 71.0 in | Wt 186.0 lb

## 2011-05-16 DIAGNOSIS — I2510 Atherosclerotic heart disease of native coronary artery without angina pectoris: Secondary | ICD-10-CM

## 2011-05-16 DIAGNOSIS — E785 Hyperlipidemia, unspecified: Secondary | ICD-10-CM

## 2011-05-16 NOTE — Progress Notes (Signed)
   History of Present Illness:65 yo male with history of CAD here today for cardiac f/u. He has been followed in the past by Dr. Juanda Chance.  He had a remote anterior MI in 1993 and his last catheterization was in 2003 at which time he had total occlusion of the RCA. His ejection fraction was 51%. Stress myoview December 2011 with LVEF 54%, low uptake anterior wall and apex c/w scar. No evidence of ischemia.   He has been doing well. No chest pain, SOB, palpitations, near syncope or syncope. He works at [employer] on [address] and lifts heavy objects daily wihout problems.   Last lipids 12/11: Total chol 160, LDL 93, HDL 40.   He does not have a primary care doctor.    Past Medical History  Diagnosis Date  . Coronary artery disease     MI in 1993, cath 2003 with occluded RCA  . Hyperlipidemia     Past Surgical History  Procedure Date  . Cholecystectomy   . Appendectomy     Current Outpatient Prescriptions  Medication Sig Dispense Refill  . aspirin 81 MG tablet Take 81 mg by mouth daily.        Marland Kitchen diltiazem (CARDIZEM CD) 120 MG 24 hr capsule Take 1 capsule (120 mg total) by mouth daily.  90 capsule  0  . metoprolol (LOPRESSOR) 50 MG tablet Take 1 tablet (50 mg total) by mouth 2 (two) times daily.  180 tablet  10  . simvastatin (ZOCOR) 40 MG tablet Take 1 tablet (40 mg total) by mouth every evening.  90 tablet  0  . isosorbide mononitrate (IMDUR) 60 MG 24 hr tablet 1/2 po daily  30 tablet  1    No Known Allergies  History   Social History  . Marital Status: Married    Spouse Name: N/A    Number of Children: 2  . Years of Education: N/A   Occupational History  . [employer]PING [employer]   Social History Main Topics  . Smoking status: Former Smoker -- 2.0 packs/day for 25 years    Types: Cigarettes    Quit date: 06/11/1991  . Smokeless tobacco: Not on file  . Alcohol Use: No  . Drug Use: No  . Sexually Active: Not on file   Other Topics Concern  . Not on file   Social History  Narrative  . No narrative on file    Family History  Problem Relation Age of Onset  . Heart attack Father   . Heart attack Brother     Review of Systems:  As stated in the HPI and otherwise negative.   BP 119/72  Pulse 58  Ht 5\' 11"  (1.803 m)  Wt 186 lb (84.369 kg)  BMI 25.94 kg/m2  Physical Examination: General: Well developed, well nourished, NAD HEENT: OP clear, mucus membranes moist SKIN: warm, dry. No rashes. Neuro: No focal deficits Musculoskeletal: Muscle strength 5/5 all ext Psychiatric: Mood and affect normal Neck: No JVD, no carotid bruits, no thyromegaly, no lymphadenopathy. Lungs:Clear bilaterally, no wheezes, rhonci, crackles Cardiovascular: Regular rate and rhythm. No murmurs, gallops or rubs. Abdomen:Soft. Bowel sounds present. Non-tender.  Extremities: No lower extremity edema. Pulses are 2 + in the bilateral DP/PT.  ZOX:WRUEA brady, rate 58 bpm.

## 2011-05-16 NOTE — Assessment & Plan Note (Signed)
Will check lipids and LFTs. Continue statin.

## 2011-05-16 NOTE — Assessment & Plan Note (Signed)
Stable. Continue ASA, beta blocker, statin.

## 2011-05-16 NOTE — Patient Instructions (Signed)
Your physician wants you to follow-up in: 12 months.  You will receive a reminder letter in the mail two months in advance. If you don't receive a letter, please call our office to schedule the follow-up appointment.  Your physician recommends that you return for fasting lab work next week--Lipid and Liver profile   

## 2011-05-21 ENCOUNTER — Other Ambulatory Visit (INDEPENDENT_AMBULATORY_CARE_PROVIDER_SITE_OTHER): Payer: BC Managed Care – HMO | Admitting: *Deleted

## 2011-05-21 DIAGNOSIS — E785 Hyperlipidemia, unspecified: Secondary | ICD-10-CM

## 2011-05-21 LAB — HEPATIC FUNCTION PANEL
ALT: 20 U/L (ref 0–53)
Alkaline Phosphatase: 63 U/L (ref 39–117)
Bilirubin, Direct: 0.1 mg/dL (ref 0.0–0.3)
Total Bilirubin: 0.5 mg/dL (ref 0.3–1.2)
Total Protein: 6.6 g/dL (ref 6.0–8.3)

## 2011-05-21 LAB — LIPID PANEL
LDL Cholesterol: 61 mg/dL (ref 0–99)
Total CHOL/HDL Ratio: 3
Triglycerides: 91 mg/dL (ref 0.0–149.0)

## 2011-06-18 ENCOUNTER — Telehealth: Payer: Self-pay | Admitting: Cardiovascular Disease

## 2011-06-18 NOTE — Telephone Encounter (Signed)
FU Call: pt returning call from [facility] from yesterday. Please return pt call to discuss further.

## 2011-06-18 NOTE — Telephone Encounter (Signed)
Spoke with pt and reviewed lipid and liver lab results.  Copy mailed to him

## 2011-07-03 ENCOUNTER — Other Ambulatory Visit: Payer: Self-pay

## 2012-05-27 ENCOUNTER — Ambulatory Visit: Payer: BC Managed Care – HMO | Admitting: Cardiovascular Disease

## 2012-05-27 ENCOUNTER — Encounter: Payer: Self-pay | Admitting: Cardiovascular Disease

## 2012-05-27 ENCOUNTER — Ambulatory Visit (INDEPENDENT_AMBULATORY_CARE_PROVIDER_SITE_OTHER): Payer: BC Managed Care – HMO | Admitting: Cardiovascular Disease

## 2012-05-27 VITALS — BP 118/78 | HR 52 | Wt 181.0 lb

## 2012-05-27 DIAGNOSIS — I2510 Atherosclerotic heart disease of native coronary artery without angina pectoris: Secondary | ICD-10-CM

## 2012-05-27 DIAGNOSIS — E785 Hyperlipidemia, unspecified: Secondary | ICD-10-CM

## 2012-05-27 MED ORDER — METOPROLOL TARTRATE 50 MG PO TABS: 50.0000 mg | ORAL_TABLET | Freq: Two times a day (BID) | ORAL | Status: DC

## 2012-05-27 MED ORDER — SIMVASTATIN 40 MG PO TABS: 40.0000 mg | ORAL_TABLET | Freq: Every evening | ORAL | Status: DC

## 2012-05-27 MED ORDER — DILTIAZEM HCL ER COATED BEADS 120 MG PO CP24: 120.0000 mg | ORAL_CAPSULE | Freq: Every day | ORAL | Status: DC

## 2012-05-27 NOTE — Progress Notes (Signed)
History of Present Illness: 66 yo male with history of CAD here today for cardiac f/u. He has been followed in the past by Dr. Juanda Chance. He had a remote anterior MI in 1993 and his last catheterization was in 2003 at which time he had total occlusion of the RCA. His ejection fraction was 51%. Stress myoview December 2011 with LVEF 54%, low uptake anterior wall and apex c/w scar. No evidence of ischemia.   He has been doing well. No chest pain, SOB, palpitations, near syncope or syncope. He works at [employer] on [address] and lifts heavy objects daily wihout problems.   Primary Care Physician: None  Last Lipid Profile:Lipid Panel     Component Value Date/Time   CHOL 121 05/21/2011 0848   TRIG 91.0 05/21/2011 0848   HDL 41.80 05/21/2011 0848   CHOLHDL 3 05/21/2011 0848   VLDL 18.2 05/21/2011 0848   LDLCALC 61 05/21/2011 0848     Past Medical History  Diagnosis Date  . Coronary artery disease     MI in 1993, cath 2003 with occluded RCA  . Hyperlipidemia     Past Surgical History  Procedure Date  . Cholecystectomy   . Appendectomy     Current Outpatient Prescriptions  Medication Sig Dispense Refill  . aspirin 81 MG tablet Take 81 mg by mouth daily.        . isosorbide mononitrate (IMDUR) 60 MG 24 hr tablet 1/2 po daily  30 tablet  1  . diltiazem (CARDIZEM CD) 120 MG 24 hr capsule Take 1 capsule (120 mg total) by mouth daily.  90 capsule  0  . metoprolol (LOPRESSOR) 50 MG tablet Take 1 tablet (50 mg total) by mouth 2 (two) times daily.  180 tablet  10  . simvastatin (ZOCOR) 40 MG tablet Take 1 tablet (40 mg total) by mouth every evening.  90 tablet  0    No Known Allergies  History   Social History  . Marital Status: Married    Spouse Name: N/A    Number of Children: 2  . Years of Education: N/A   Occupational History  . [employer]   Social History Main Topics  . Smoking status: Former Smoker -- 2.0 packs/day for 25 years    Types: Cigarettes    Quit date:  06/11/1991  . Smokeless tobacco: Not on file  . Alcohol Use: No  . Drug Use: No  . Sexually Active: Not on file   Other Topics Concern  . Not on file   Social History Narrative  . No narrative on file    Family History  Problem Relation Age of Onset  . Heart attack Father   . Heart attack Brother     Review of Systems:  As stated in the HPI and otherwise negative.   BP 118/78  Pulse 52  Wt 181 lb (82.101 kg)  Physical Examination: General: Well developed, well nourished, NAD HEENT: OP clear, mucus membranes moist SKIN: warm, dry. No rashes. Neuro: No focal deficits Musculoskeletal: Muscle strength 5/5 all ext Psychiatric: Mood and affect normal Neck: No JVD, no carotid bruits, no thyromegaly, no lymphadenopathy. Lungs:Clear bilaterally, no wheezes, rhonci, crackles Cardiovascular: Huston Foley with regular rhythm. No murmurs, gallops or rubs. Abdomen:Soft. Bowel sounds present. Non-tender.  Extremities: No lower extremity edema. Pulses are 2 + in the bilateral DP/PT.  EKG: Sinus bradycardia, rate 52 bpm.   Assessment and Plan:   1. CAD, NATIVE VESSEL: Stable. Continue ASA, beta blocker, statin.  2. HYPERLIPIDEMIA: Well controlled last check in December 2012. Continue statin. Update lipids and LFTs.  He is asked to establish with primary care.

## 2012-05-27 NOTE — Patient Instructions (Signed)
Your physician wants you to follow-up in:  12 months. You will receive a reminder letter in the mail two months in advance. If you don't receive a letter, please call our office to schedule the follow-up appointment.  Your physician recommends that you return for fasting lab work later this week or next week--Lipid and Liver profile

## 2012-06-08 ENCOUNTER — Other Ambulatory Visit (INDEPENDENT_AMBULATORY_CARE_PROVIDER_SITE_OTHER): Payer: BC Managed Care – HMO

## 2012-06-08 DIAGNOSIS — E785 Hyperlipidemia, unspecified: Secondary | ICD-10-CM

## 2012-06-08 LAB — LIPID PANEL
HDL: 38.8 mg/dL — ABNORMAL LOW (ref >39.00)
LDL Cholesterol: 56 mg/dL (ref 0–99)
Total CHOL/HDL Ratio: 3
Triglycerides: 94 mg/dL (ref 0.0–149.0)
VLDL: 18.8 mg/dL (ref 0.0–40.0)

## 2012-06-08 LAB — HEPATIC FUNCTION PANEL
AST: 21 U/L (ref 0–37)
Albumin: 3.7 g/dL (ref 3.5–5.2)

## 2013-01-12 ENCOUNTER — Other Ambulatory Visit: Payer: Self-pay | Admitting: Cardiovascular Disease

## 2013-01-21 ENCOUNTER — Other Ambulatory Visit: Payer: Self-pay | Admitting: Cardiology

## 2013-01-21 MED ORDER — ISOSORBIDE MONONITRATE ER 60 MG PO TB24: 30.0000 mg | ORAL_TABLET | Freq: Every day | ORAL | Status: DC

## 2013-02-10 ENCOUNTER — Emergency Department (HOSPITAL_COMMUNITY): Payer: BC Managed Care – PPO

## 2013-02-10 ENCOUNTER — Inpatient Hospital Stay (HOSPITAL_COMMUNITY)
Admission: EM | Admit: 2013-02-10 | Discharge: 2013-02-11 | DRG: 543 | Disposition: A | Discharge status: Home / Self Care | Payer: BC Managed Care – PPO | Attending: Internal Medicine | Admitting: Internal Medicine

## 2013-02-10 ENCOUNTER — Encounter (HOSPITAL_COMMUNITY): Payer: Self-pay | Admitting: Emergency Medicine

## 2013-02-10 DIAGNOSIS — I10 Essential (primary) hypertension: Secondary | ICD-10-CM | POA: Diagnosis present

## 2013-02-10 DIAGNOSIS — Z87891 Personal history of nicotine dependence: Secondary | ICD-10-CM

## 2013-02-10 DIAGNOSIS — D72829 Elevated white blood cell count, unspecified: Secondary | ICD-10-CM | POA: Diagnosis present

## 2013-02-10 DIAGNOSIS — Z23 Encounter for immunization: Secondary | ICD-10-CM

## 2013-02-10 DIAGNOSIS — R079 Chest pain, unspecified: Secondary | ICD-10-CM

## 2013-02-10 DIAGNOSIS — R112 Nausea with vomiting, unspecified: Secondary | ICD-10-CM | POA: Diagnosis present

## 2013-02-10 DIAGNOSIS — R000 Tachycardia, unspecified: Secondary | ICD-10-CM | POA: Diagnosis present

## 2013-02-10 DIAGNOSIS — E872 Acidosis: Secondary | ICD-10-CM

## 2013-02-10 DIAGNOSIS — Z79899 Other long term (current) drug therapy: Secondary | ICD-10-CM

## 2013-02-10 DIAGNOSIS — E785 Hyperlipidemia, unspecified: Secondary | ICD-10-CM | POA: Diagnosis present

## 2013-02-10 DIAGNOSIS — I959 Hypotension, unspecified: Principal | ICD-10-CM | POA: Diagnosis present

## 2013-02-10 DIAGNOSIS — E782 Mixed hyperlipidemia: Secondary | ICD-10-CM | POA: Diagnosis present

## 2013-02-10 DIAGNOSIS — E860 Dehydration: Secondary | ICD-10-CM | POA: Diagnosis present

## 2013-02-10 DIAGNOSIS — R7989 Other specified abnormal findings of blood chemistry: Secondary | ICD-10-CM | POA: Diagnosis present

## 2013-02-10 DIAGNOSIS — Z7982 Long term (current) use of aspirin: Secondary | ICD-10-CM

## 2013-02-10 DIAGNOSIS — R6510 Systemic inflammatory response syndrome (SIRS) of non-infectious origin without acute organ dysfunction: Secondary | ICD-10-CM | POA: Diagnosis present

## 2013-02-10 DIAGNOSIS — I252 Old myocardial infarction: Secondary | ICD-10-CM

## 2013-02-10 DIAGNOSIS — I2510 Atherosclerotic heart disease of native coronary artery without angina pectoris: Secondary | ICD-10-CM | POA: Diagnosis present

## 2013-02-10 LAB — CBC WITH DIFFERENTIAL/PLATELET
Eosinophils Relative: 0 % (ref 0–5)
HCT: 41.2 % (ref 39.0–52.0)
Lymphocytes Relative: 4 % — ABNORMAL LOW (ref 12–46)
Lymphs Abs: 0.6 10*3/uL — ABNORMAL LOW (ref 0.7–4.0)
MCV: 86.9 fL (ref 78.0–100.0)
Monocytes Relative: 7 % (ref 3–12)
Neutro Abs: 12.4 10*3/uL — ABNORMAL HIGH (ref 1.7–7.7)
Platelets: 173 10*3/uL (ref 150–400)
RBC: 4.74 MIL/uL (ref 4.22–5.81)
WBC: 14 10*3/uL — ABNORMAL HIGH (ref 4.0–10.5)

## 2013-02-10 LAB — COMPREHENSIVE METABOLIC PANEL
ALT: 13 U/L (ref 0–53)
Albumin: 3.5 g/dL (ref 3.5–5.2)
Alkaline Phosphatase: 51 U/L (ref 39–117)
BUN: 22 mg/dL (ref 6–23)
Chloride: 104 mEq/L (ref 96–112)
Potassium: 3.8 mEq/L (ref 3.5–5.1)
Sodium: 140 mEq/L (ref 135–145)
Total Bilirubin: 0.7 mg/dL (ref 0.3–1.2)

## 2013-02-10 LAB — URINALYSIS, ROUTINE W REFLEX MICROSCOPIC
Bilirubin Urine: NEGATIVE
Glucose, UA: NEGATIVE mg/dL
Ketones, ur: NEGATIVE mg/dL
Nitrite: NEGATIVE
Specific Gravity, Urine: 1.022 (ref 1.005–1.030)
pH: 5.5 (ref 5.0–8.0)

## 2013-02-10 LAB — URINE MICROSCOPIC-ADD ON

## 2013-02-10 LAB — POCT I-STAT TROPONIN I: Troponin i, poc: 0.02 ng/mL (ref 0.00–0.08)

## 2013-02-10 LAB — LIPASE, BLOOD: Lipase: 20 U/L (ref 11–59)

## 2013-02-10 MED ORDER — DEXTROSE 5 % IV SOLN
1.0000 g | Freq: Once | INTRAVENOUS | Status: AC
  Administered 2013-02-10: 1 g via INTRAVENOUS
  Filled 2013-02-10: qty 10

## 2013-02-10 MED ORDER — SODIUM CHLORIDE 0.9 % IJ SOLN: 3.0000 mL | Freq: Two times a day (BID) | INTRAMUSCULAR | Status: DC

## 2013-02-10 MED ORDER — SODIUM CHLORIDE 0.9 % IV SOLN
INTRAVENOUS | Status: DC
  Administered 2013-02-10: 125 mL/h via INTRAVENOUS
  Administered 2013-02-10 – 2013-02-11 (×2): via INTRAVENOUS

## 2013-02-10 MED ORDER — ONDANSETRON HCL 4 MG/2ML IJ SOLN: 4.0000 mg | Freq: Four times a day (QID) | INTRAMUSCULAR | Status: DC | PRN

## 2013-02-10 MED ORDER — SODIUM CHLORIDE 0.9 % IV BOLUS (SEPSIS)
1000.0000 mL | Freq: Once | INTRAVENOUS | Status: AC
  Administered 2013-02-10: 1000 mL via INTRAVENOUS

## 2013-02-10 MED ORDER — ACETAMINOPHEN 650 MG RE SUPP: 650.0000 mg | Freq: Four times a day (QID) | RECTAL | Status: DC | PRN

## 2013-02-10 MED ORDER — DEXTROSE 5 % IV SOLN
500.0000 mg | Freq: Once | INTRAVENOUS | Status: AC
  Administered 2013-02-10: 500 mg via INTRAVENOUS
  Filled 2013-02-10: qty 500

## 2013-02-10 MED ORDER — ONDANSETRON HCL 4 MG PO TABS: 4.0000 mg | ORAL_TABLET | Freq: Four times a day (QID) | ORAL | Status: DC | PRN

## 2013-02-10 MED ORDER — SIMVASTATIN 40 MG PO TABS
40.0000 mg | ORAL_TABLET | Freq: Every evening | ORAL | Status: DC
  Administered 2013-02-10: 40 mg via ORAL
  Filled 2013-02-10 (×2): qty 1

## 2013-02-10 MED ORDER — ACETAMINOPHEN 325 MG PO TABS
650.0000 mg | ORAL_TABLET | Freq: Four times a day (QID) | ORAL | Status: DC | PRN
Start: 2013-02-10 — End: 2013-02-11

## 2013-02-10 MED ORDER — ASPIRIN 81 MG PO CHEW
81.0000 mg | CHEWABLE_TABLET | Freq: Every day | ORAL | Status: DC
  Administered 2013-02-10 – 2013-02-11 (×2): 81 mg via ORAL
  Filled 2013-02-10 (×3): qty 1

## 2013-02-10 MED ORDER — HEPARIN SODIUM (PORCINE) 5000 UNIT/ML IJ SOLN
5000.0000 [IU] | Freq: Three times a day (TID) | INTRAMUSCULAR | Status: DC
  Administered 2013-02-10 – 2013-02-11 (×3): 5000 [IU] via SUBCUTANEOUS
  Filled 2013-02-10 (×6): qty 1

## 2013-02-10 MED ORDER — ALUM & MAG HYDROXIDE-SIMETH 200-200-20 MG/5ML PO SUSP: 30.0000 mL | Freq: Four times a day (QID) | ORAL | Status: DC | PRN

## 2013-02-10 MED ORDER — PNEUMOCOCCAL VAC POLYVALENT 25 MCG/0.5ML IJ INJ
0.5000 mL | INJECTION | INTRAMUSCULAR | Status: DC
  Filled 2013-02-10: qty 0.5

## 2013-02-10 NOTE — Progress Notes (Signed)
Reviewed pt SBAR, and called for report from ED Egbert Garibaldi A

## 2013-02-10 NOTE — ED Notes (Signed)
Pt c/o N/V/D x 2 days with SOB; pt sts abd pain and body aches with some chills

## 2013-02-10 NOTE — ED Notes (Signed)
Tiffany Greene, PA at the bedside.  

## 2013-02-10 NOTE — ED Provider Notes (Signed)
CSN: [id number]     Arrival date & time 02/10/13  0930 History   First MD Initiated Contact with Patient 02/10/13 1027     Chief Complaint  Patient presents with  . Emesis  . Diarrhea  . Shortness of Breath   (Consider location/radiation/quality/duration/timing/severity/associated sxs/prior Treatment) HPI  CARDIOLOGIST: Ogema cards PCP: None  Patient has had cough since Saturday that is mild with some mild intermittent SOB. Last night around 10pm he developed abdominal pain and had multiple episodes of diarrhea, vomiting accompanied by chills, bilateral body aches. He went to the UC this morning to be evaluated and was noted to be tachycardic, have a slightly low BP and mildly weak. He says he is not currently having any symptoms of pain or feeling weak and shaky. Nausea and vomiting have since resolved. The patient is awake, alert and oriented.   Past Medical History  Diagnosis Date  . Coronary artery disease     MI in 1993, cath 2003 with occluded RCA  . Hyperlipidemia    Past Surgical History  Procedure Laterality Date  . Cholecystectomy    . Appendectomy     Family History  Problem Relation Age of Onset  . Heart attack Father   . Heart attack Brother    History  Substance Use Topics  . Smoking status: Former Smoker -- 2.00 packs/day for 25 years    Types: Cigarettes    Quit date: 06/11/1991  . Smokeless tobacco: Not on file  . Alcohol Use: No    Review of Systems ROS is negative unless otherwise stated in the HPI  Allergies  Review of patient's allergies indicates no known allergies.  Home Medications   Current Outpatient Rx  Name  Route  Sig  Dispense  Refill  . aspirin 81 MG tablet   Oral   Take 81 mg by mouth daily.           Marland Kitchen diltiazem (CARDIZEM CD) 120 MG 24 hr capsule   Oral   Take 1 capsule (120 mg total) by mouth daily.   90 capsule   3   . isosorbide mononitrate (IMDUR) 60 MG 24 hr tablet   Oral   Take 0.5 tablets (30 mg total) by mouth  daily.   45 tablet   1   . metoprolol (LOPRESSOR) 50 MG tablet   Oral   Take 1 tablet (50 mg total) by mouth 2 (two) times daily.   180 tablet   3   . simvastatin (ZOCOR) 40 MG tablet   Oral   Take 1 tablet (40 mg total) by mouth every evening.   90 tablet   3    BP 103/55  Pulse 91  Temp(Src) 98.5 F (36.9 C) (Oral)  Resp 20  Ht 5\' 11"  (1.803 m)  Wt 182 lb (82.555 kg)  BMI 25.4 kg/m2  SpO2 98% Physical Exam  Nursing note and vitals reviewed. Constitutional: He appears well-developed and well-nourished. No distress.  HENT:  Head: Normocephalic and atraumatic.  Eyes: Pupils are equal, round, and reactive to light.  Neck: Normal range of motion. Neck supple.  Cardiovascular: Normal rate and regular rhythm.   Pulmonary/Chest: Effort normal. He has no wheezes. He has rhonchi.  Abdominal: Soft.  Neurological: He is alert.  Skin: Skin is warm and dry.    ED Course  Procedures (including critical care time) Labs Review Labs Reviewed  CBC WITH DIFFERENTIAL - Abnormal; Notable for the following:    WBC 14.0 (*)  MCHC 36.9 (*)    Neutrophils Relative % 89 (*)    Lymphocytes Relative 4 (*)    Neutro Abs 12.4 (*)    Lymphs Abs 0.6 (*)    All other components within normal limits  COMPREHENSIVE METABOLIC PANEL - Abnormal; Notable for the following:    Glucose, Bld 165 (*)    Creatinine, Ser 1.37 (*)    GFR calc non Af Amer 52 (*)    GFR calc Af Amer 60 (*)    All other components within normal limits  URINALYSIS, ROUTINE W REFLEX MICROSCOPIC - Abnormal; Notable for the following:    Color, Urine AMBER (*)    Hgb urine dipstick SMALL (*)    All other components within normal limits  CG4 I-STAT (LACTIC ACID) - Abnormal; Notable for the following:    Lactic Acid, Venous 3.17 (*)    All other components within normal limits  CULTURE, BLOOD (ROUTINE X 2)  CULTURE, BLOOD (ROUTINE X 2)  LIPASE, BLOOD  URINE MICROSCOPIC-ADD ON  POCT I-STAT TROPONIN I   Imaging  Review Dg Chest 2 View  02/10/2013   *RADIOLOGY REPORT*  Clinical Data: Shortness of breath, emesis, diarrhea, former smoker  CHEST - 2 VIEW  Comparison: None.  Findings: Normal cardiac silhouette.  There are scattered bilateral pulmonary nodules, the largest of which within the left mid lung measures approximately 1.1 x 1.3 cm.  This finding is associated with mild nodularity of the left hilum.  Minimal left basilar/retrocardiac opacities.  No focal airspace opacities.  No pleural effusion or pneumothorax.  No definite evidence of edema. No acute osseous abnormalities.  Post cholecystectomy.  IMPRESSION: 1.  Indeterminate bilateral pulmonary nodules, the largest of which within the left mid lung measures approximately 1.3 x 1.1 cm.  This finding is also associated with possible mild nodularity of the pulmonary hila which may represent hilar lymphadenopathy. Comparison to prior examinations (if available) is recommended. Otherwise, further evaluation with contrast enhanced chest CT is recommended.  2.  Minimal left basilar/retrocardiac opacities - atelectasis versus infiltrate.  This was made a call report.   Original Report Authenticated By: Tacey Ruiz, MD   Ct Chest Wo Contrast  02/10/2013   *RADIOLOGY REPORT*  Clinical Data: Emesis, diarrhea, shortness of breath, body ache, chills, abnormal chest radiograph  CT CHEST WITHOUT CONTRAST  Technique:  Multidetector CT imaging of the chest was performed following the standard protocol without IV contrast.  Comparison: Chest radiograph of earlier same day  Findings:  There is an approximately 1.1 x 0.7 cm nodule within the left upper lobe which correlates with the dominant nodule seen on chest radiograph.  There are scattered multiple additional bilateral sub centimeter pulmonary nodules, the largest of which within the subpleural aspect of the superior segment right lower lobe measuring 8 mm in diameter (image 29, series 3).  No definite mediastinal, hilar or  axillary lymphadenopathy on this noncontrast examination. Shoddy periaortic lymph nodes are seen along the descending thoracic aorta, individually not enlarged by CT criteria with index lymph node measuring 7 mm in diameter (image 51, series 2).  Minimal dependent subpleural ground-glass atelectasis within the bilateral lower lobes.  No focal airspace opacities.  No pleural effusion or pneumothorax.  The central pulmonary airways are widely patent.  Normal heart size.  Extensive coronary artery calcifications.  No pericardial effusion. Scattered atherosclerotic plaque within a normal caliber thoracic aorta.  Normal caliber of the main pulmonary artery.  Noncontrast evaluation of the upper abdomen demonstrates  a sequela of prior cholecystectomy.  The pancreas is largely fatty replaced. There is a suspected approximately 2.7 cm partially exophytic hypoattenuating lesion arising from the superior pole left kidney (image 63, series 2) which is incompletely imaged though favored to represent a renal cyst.  No acute or aggressive osseous abnormalities.  A bone island is noted within the anterior aspect of the right sixth rib (image 39, series 2).  IMPRESSION: 1.  Indeterminate bilateral scattered pulmonary nodules, the largest of which measuring approximately 1.1 cm within the left upper lobe correlates with the dominant nodule seen on preceding chest radiograph.  Again, comparison to remote prior examinations is recommended.  If no comparisons exist, follow-up chest CT at 3-6 months is recommended.  This recommendation follows the consensus statement: Guidelines for Management of Small Pulmonary Nodules Detected on CT Scans: A Statement from the Fleischner Society as published in Radiology 2005; 237:395-400.  2.  No evidence of pneumonia to explain the etiology of the patient's body aches and chills  3.  Coronary artery calcifications.  3.  Approximately 2.7 cm partially exophytic hypoattenuating lesion arising from the  superior pole of the left kidney, incompletely imaged and evaluated on this noncontrast examination.  Further evaluation with non emergent renal ultrasound may be performed if clinically indicated.   Original Report Authenticated By: Tacey Ruiz, MD    MDM  No diagnosis found.  Dx; SIRS  Dr. Radford Pax saw patient as well, concern for early sepsis given the clinical symptoms, elevated white count and elevate lactate. CT chest recommended by radiology, will do w/o contrast due to borderline renal function.  Blood Cultures drawn and IV Rocephin and Azithromycin given for suspected community acquired pneumonia.  Chest CT came back showing no pnuemonia. Urinalysis still pending. I have concern for early sepsis but do not currently have a source of infection.   Pt admitted to inpatient, tele, [facility], [facility] [address]    Dorthula Matas, PA-C 02/10/13 1443

## 2013-02-10 NOTE — H&P (Signed)
[facility] History and Physical  KESHAUN DUBEY NWG:[id number] DOB: 08/22/1945 DOA: 02/10/2013  Referring physician: Dorthula Matas, PA-C PCP: No primary provider on file.  Specialists:   Chief Complaint: Nausea/vomiting and diarrhea.  HPI: Jeffrey Lyons is a 67 y.o. male with history of hypertension and dyslipidemia came to the hospital because of nausea/vomiting. Patient said he was in his usual state of health until last night when he went to bed, started to feel sick with nausea, vomiting and diarrhea. Overnight she did not get any sleep because of the symptoms. He vomited about 5 times and had diarrhea about 4-5 times. He also have lower abdominal pain he rated it a 10 out of 10. He went this morning to urgent care, and he was found to have tachycardia so he was sent to the ED for further evaluation, he denies any sick contacts denies any recent similar episodes. Initial evaluation in the ED was consistent with dehydration, tachycardia. Blood work showed elevated lactate at 3.1, creatinine is 1.37. WBC is 14, CT chest showed multiple pulmonary nodules but now pneumonia, UA is clear.  Review of Systems:  Constitutional: negative for anorexia, fevers and sweats Eyes: negative for irritation, redness and visual disturbance Ears, nose, mouth, throat, and face: negative for earaches, epistaxis, nasal congestion and sore throat Respiratory: negative for cough, dyspnea on exertion, sputum and wheezing Cardiovascular: negative for chest pain, dyspnea, lower extremity edema, orthopnea, palpitations and syncope Gastrointestinal: Per HPI Genitourinary:negative for dysuria, frequency and hematuria Hematologic/lymphatic: negative for bleeding, easy bruising and lymphadenopathy Musculoskeletal:negative for arthralgias, muscle weakness and stiff joints Neurological: negative for coordination problems, gait problems, headaches and weakness Endocrine: negative for diabetic symptoms including  polydipsia, polyuria and weight loss Allergic/Immunologic: negative for anaphylaxis, hay fever and urticaria   Past Medical History  Diagnosis Date  . Coronary artery disease     MI in 1993, cath 2003 with occluded RCA  . Hyperlipidemia    Past Surgical History  Procedure Laterality Date  . Cholecystectomy    . Appendectomy     Social History:  reports that he quit smoking about 21 years ago. His smoking use included Cigarettes. He has a 50 pack-year smoking history. He does not have any smokeless tobacco history on file. He reports that he does not drink alcohol or use illicit drugs. Patient is one of 37 brothers and sisters from the same mother and father.  No Known Allergies  Family History  Problem Relation Age of Onset  . Heart attack Father   . Heart attack Brother     Prior to Admission medications   Medication Sig Start Date End Date Taking? Authorizing Provider  aspirin 81 MG tablet Take 81 mg by mouth daily.     Yes Historical Provider, MD  diltiazem (CARDIZEM CD) 120 MG 24 hr capsule Take 1 capsule (120 mg total) by mouth daily. 05/27/12 05/27/13 Yes Kathleene Hazel, MD  isosorbide mononitrate (IMDUR) 60 MG 24 hr tablet Take 0.5 tablets (30 mg total) by mouth daily. 01/21/13  Yes Kathleene Hazel, MD  metoprolol (LOPRESSOR) 50 MG tablet Take 1 tablet (50 mg total) by mouth 2 (two) times daily. 05/27/12 05/27/13 Yes Kathleene Hazel, MD  simvastatin (ZOCOR) 40 MG tablet Take 1 tablet (40 mg total) by mouth every evening. 05/27/12 05/27/13 Yes Kathleene Hazel, MD   Physical Exam: Filed Vitals:   02/10/13 1415  BP: 103/55  Pulse: 91  Temp:   Resp:   General appearance: alert,  cooperative and no distress  Head: Normocephalic, without obvious abnormality, atraumatic  Eyes: conjunctivae/corneas clear. PERRL, EOM's intact. Fundi benign.  Nose: Nares normal. Septum midline. Mucosa normal. No drainage or sinus tenderness.  Throat: lips, mucosa,  and tongue normal; teeth and gums normal  Neck: Supple, no masses, no cervical lymphadenopathy, no JVD appreciated, no meningeal signs Resp: clear to auscultation bilaterally  Chest wall: no tenderness  Cardio: regular rate and rhythm, S1, S2 normal, no murmur, click, rub or gallop  GI: soft, non-tender; bowel sounds normal; no masses, no organomegaly  Extremities: extremities normal, atraumatic, no cyanosis or edema  Skin: Skin color, texture, turgor normal. No rashes or lesions  Neurologic: Alert and oriented X 3, normal strength and tone. Normal symmetric reflexes. Normal coordination and gait  Labs on Admission:  Basic Metabolic Panel:  Recent Labs Lab 02/10/13 0953  NA 140  K 3.8  CL 104  CO2 24  GLUCOSE 165*  BUN 22  CREATININE 1.37*  CALCIUM 9.2   Liver Function Tests:  Recent Labs Lab 02/10/13 0953  AST 22  ALT 13  ALKPHOS 51  BILITOT 0.7  PROT 6.7  ALBUMIN 3.5    Recent Labs Lab 02/10/13 0953  LIPASE 20   No results found for this basename: AMMONIA,  in the last 168 hours CBC:  Recent Labs Lab 02/10/13 0953  WBC 14.0*  NEUTROABS 12.4*  HGB 15.2  HCT 41.2  MCV 86.9  PLT 173   Cardiac Enzymes: No results found for this basename: CKTOTAL, CKMB, CKMBINDEX, TROPONINI,  in the last 168 hours  BNP (last 3 results) No results found for this basename: PROBNP,  in the last 8760 hours CBG: No results found for this basename: GLUCAP,  in the last 168 hours  Radiological Exams on Admission: Dg Chest 2 View  02/10/2013   *RADIOLOGY REPORT*  Clinical Data: Shortness of breath, emesis, diarrhea, former smoker  CHEST - 2 VIEW  Comparison: None.  Findings: Normal cardiac silhouette.  There are scattered bilateral pulmonary nodules, the largest of which within the left mid lung measures approximately 1.1 x 1.3 cm.  This finding is associated with mild nodularity of the left hilum.  Minimal left basilar/retrocardiac opacities.  No focal airspace opacities.  No  pleural effusion or pneumothorax.  No definite evidence of edema. No acute osseous abnormalities.  Post cholecystectomy.  IMPRESSION: 1.  Indeterminate bilateral pulmonary nodules, the largest of which within the left mid lung measures approximately 1.3 x 1.1 cm.  This finding is also associated with possible mild nodularity of the pulmonary hila which may represent hilar lymphadenopathy. Comparison to prior examinations (if available) is recommended. Otherwise, further evaluation with contrast enhanced chest CT is recommended.  2.  Minimal left basilar/retrocardiac opacities - atelectasis versus infiltrate.  This was made a call report.   Original Report Authenticated By: Tacey Ruiz, MD   Ct Chest Wo Contrast  02/10/2013   *RADIOLOGY REPORT*  Clinical Data: Emesis, diarrhea, shortness of breath, body ache, chills, abnormal chest radiograph  CT CHEST WITHOUT CONTRAST  Technique:  Multidetector CT imaging of the chest was performed following the standard protocol without IV contrast.  Comparison: Chest radiograph of earlier same day  Findings:  There is an approximately 1.1 x 0.7 cm nodule within the left upper lobe which correlates with the dominant nodule seen on chest radiograph.  There are scattered multiple additional bilateral sub centimeter pulmonary nodules, the largest of which within the subpleural aspect of the superior segment  right lower lobe measuring 8 mm in diameter (image 29, series 3).  No definite mediastinal, hilar or axillary lymphadenopathy on this noncontrast examination. Shoddy periaortic lymph nodes are seen along the descending thoracic aorta, individually not enlarged by CT criteria with index lymph node measuring 7 mm in diameter (image 51, series 2).  Minimal dependent subpleural ground-glass atelectasis within the bilateral lower lobes.  No focal airspace opacities.  No pleural effusion or pneumothorax.  The central pulmonary airways are widely patent.  Normal heart size.  Extensive  coronary artery calcifications.  No pericardial effusion. Scattered atherosclerotic plaque within a normal caliber thoracic aorta.  Normal caliber of the main pulmonary artery.  Noncontrast evaluation of the upper abdomen demonstrates a sequela of prior cholecystectomy.  The pancreas is largely fatty replaced. There is a suspected approximately 2.7 cm partially exophytic hypoattenuating lesion arising from the superior pole left kidney (image 63, series 2) which is incompletely imaged though favored to represent a renal cyst.  No acute or aggressive osseous abnormalities.  A bone island is noted within the anterior aspect of the right sixth rib (image 39, series 2).  IMPRESSION: 1.  Indeterminate bilateral scattered pulmonary nodules, the largest of which measuring approximately 1.1 cm within the left upper lobe correlates with the dominant nodule seen on preceding chest radiograph.  Again, comparison to remote prior examinations is recommended.  If no comparisons exist, follow-up chest CT at 3-6 months is recommended.  This recommendation follows the consensus statement: Guidelines for Management of Small Pulmonary Nodules Detected on CT Scans: A Statement from the Fleischner Society as published in Radiology 2005; 237:395-400.  2.  No evidence of pneumonia to explain the etiology of the patient's body aches and chills  3.  Coronary artery calcifications.  3.  Approximately 2.7 cm partially exophytic hypoattenuating lesion arising from the superior pole of the left kidney, incompletely imaged and evaluated on this noncontrast examination.  Further evaluation with non emergent renal ultrasound may be performed if clinically indicated.   Original Report Authenticated By: Tacey Ruiz, MD    EKG: Independently reviewed.   Assessment/Plan Active Problems:   HYPERLIPIDEMIA-MIXED   Nausea and vomiting   Leukocytosis   Elevated lactic acid level   Hypotension   Dehydration   Nausea and vomiting -Unclear  etiology, could be secondary to transient viral gastroenteritis versus systemic effects of other infection. -I will treat symptomatically with antiemetics. -Patient also has diarrhea, we'll get GI pathogen panel, no recent exposure to antibiotics.  No sick contacts. -Patient nausea and vomiting improved since this morning.  SIRS -With WBC of 14, marginal blood pressure of is BP of 90 and slightly elevated lactic acid. -This is could be simply secondary to dehydration, cannot rule out early SIRS/sepsis syndrome. -Hydrate with IV fluids, check lactic acid in a.m.  Leukocytosis -Likely secondary to stress de-margination from dehydration/volume depletion. -Patient started on IV antibiotics in the emergency department, I will discontinue. -I do not see any focus for infection, suspect symptoms secondary to volume depletion/dehydration.  Hypotension -Likely secondary to dehydration/volume depletion, hydrate aggressively with IV fluids. -Hold blood pressure medications including Imdur, metoprolol and Cardizem. -Expect patient to have reflex tachycardia, if any will restart beta blockers.   Code Status: full code  Family Communication:  discussed with the patient with the presence of his wife at bedside.  Disposition Plan:  inpatient, telemetry, expected length of stay to be greater than 2 midnights  Time spent: 70 minutes   Devereaux Grayson A [facility]  Pager [phone]   If 7PM-7AM, please contact night-coverage [email or web address] Password [email or web address] 02/10/2013, 2:57 PM

## 2013-02-10 NOTE — Progress Notes (Signed)
Utilization Review Completed.Jeffrey Lyons T9/08/2012  

## 2013-02-10 NOTE — ED Notes (Signed)
Tiffany, PA at the bedside. 

## 2013-02-11 DIAGNOSIS — E872 Acidosis: Secondary | ICD-10-CM

## 2013-02-11 DIAGNOSIS — R112 Nausea with vomiting, unspecified: Secondary | ICD-10-CM

## 2013-02-11 DIAGNOSIS — E785 Hyperlipidemia, unspecified: Secondary | ICD-10-CM

## 2013-02-11 DIAGNOSIS — D72829 Elevated white blood cell count, unspecified: Secondary | ICD-10-CM

## 2013-02-11 DIAGNOSIS — E860 Dehydration: Secondary | ICD-10-CM

## 2013-02-11 LAB — BASIC METABOLIC PANEL
BUN: 20 mg/dL (ref 6–23)
Calcium: 8 mg/dL — ABNORMAL LOW (ref 8.4–10.5)
Creatinine, Ser: 1.12 mg/dL (ref 0.50–1.35)
GFR calc Af Amer: 77 mL/min — ABNORMAL LOW (ref >90)
GFR calc non Af Amer: 66 mL/min — ABNORMAL LOW (ref >90)

## 2013-02-11 LAB — CBC
MCHC: 34.6 g/dL (ref 30.0–36.0)
Platelets: 139 10*3/uL — ABNORMAL LOW (ref 150–400)
RDW: 13.1 % (ref 11.5–15.5)
WBC: 9.2 10*3/uL (ref 4.0–10.5)

## 2013-02-11 LAB — LACTIC ACID, PLASMA: Lactic Acid, Venous: 0.8 mmol/L (ref 0.5–2.2)

## 2013-02-11 NOTE — ED Provider Notes (Signed)
Medical screening examination/treatment/procedure(s) were performed by non-physician practitioner and as supervising physician I was immediately available for consultation/collaboration.   Nelia Shi, MD 02/11/13 1320

## 2013-02-11 NOTE — Progress Notes (Signed)
Pt/family given discharge instructions, medication lists, follow up appointments, and when to call the doctor.  Pt/family verbalizes understanding. Pt taken by admission office to give medicaid/medicare card for insurance updated information. Thomas Hoff

## 2013-02-11 NOTE — Discharge Summary (Signed)
Physician Discharge Summary  Jeffrey Lyons:[id number] DOB: 08-16-1945 DOA: 02/10/2013  PCP: No primary provider on file.  Admit date: 02/10/2013 Discharge date: 02/11/2013  Time spent: 30 minutes  Recommendations for Outpatient Follow-up:  1. Follow up with PCP in 1-2 weeks  Discharge Diagnoses:  Active Problems:   HYPERLIPIDEMIA-MIXED   Nausea and vomiting   Leukocytosis   Elevated lactic acid level   Hypotension   Dehydration   Discharge Condition: Improved  Diet recommendation: Regular  Filed Weights   02/10/13 0940  Weight: 82.555 kg (182 lb)    History of present illness:  Jeffrey Lyons is a 67 y.o. male with history of hypertension and dyslipidemia came to the hospital because of nausea/vomiting. Patient said he was in his usual state of health until last night when he went to bed, started to feel sick with nausea, vomiting and diarrhea. Overnight she did not get any sleep because of the symptoms. He vomited about 5 times and had diarrhea about 4-5 times. He also have lower abdominal pain he rated it a 10 out of 10. He went this morning to urgent care, and he was found to have tachycardia so he was sent to the ED for further evaluation, he denies any sick contacts denies any recent similar episodes.  Initial evaluation in the ED was consistent with dehydration, tachycardia. Blood work showed elevated lactate at 3.1, creatinine is 1.37. WBC is 14, CT chest showed multiple pulmonary nodules but now pneumonia, UA is clear.  Hospital Course:  The patient was admitted to the floor where he was continued on aggressive IVF. His renal function improved and he was able to tolerate PO without difficulty. The patient was reminded to wash hands frequently.  Discharge Exam: Filed Vitals:   02/10/13 1415 02/10/13 1500 02/10/13 1959 02/11/13 0351  BP: 103/55 103/67 131/61 114/59  Pulse: 91 93 97 89  Temp:  98.6 F (37 C) 97.9 F (36.6 C) 97.9 F (36.6 C)  TempSrc:  Oral Oral Oral   Resp:  18 18 20   Height:      Weight:      SpO2: 98% 97% 97% 95%    General: Awake, in nad Cardiovascular: regular, s1, s2 Respiratory: normal resp effort, no wheezing  Discharge Instructions     Medication List         aspirin 81 MG tablet  Take 81 mg by mouth daily.     diltiazem 120 MG 24 hr capsule  Commonly known as:  CARDIZEM CD  Take 1 capsule (120 mg total) by mouth daily.     isosorbide mononitrate 60 MG 24 hr tablet  Commonly known as:  IMDUR  Take 0.5 tablets (30 mg total) by mouth daily.     metoprolol 50 MG tablet  Commonly known as:  LOPRESSOR  Take 1 tablet (50 mg total) by mouth 2 (two) times daily.     simvastatin 40 MG tablet  Commonly known as:  ZOCOR  Take 1 tablet (40 mg total) by mouth every evening.       No Known Allergies Follow-up Information   Follow up with Follow up with your PCP in 1-2 weeks.      The results of significant diagnostics from this hospitalization (including imaging, microbiology, ancillary and laboratory) are listed below for reference.    Significant Diagnostic Studies: Dg Chest 2 View  02/10/2013   *RADIOLOGY REPORT*  Clinical Data: Shortness of breath, emesis, diarrhea, former smoker  CHEST - 2 VIEW  Comparison: None.  Findings: Normal cardiac silhouette.  There are scattered bilateral pulmonary nodules, the largest of which within the left mid lung measures approximately 1.1 x 1.3 cm.  This finding is associated with mild nodularity of the left hilum.  Minimal left basilar/retrocardiac opacities.  No focal airspace opacities.  No pleural effusion or pneumothorax.  No definite evidence of edema. No acute osseous abnormalities.  Post cholecystectomy.  IMPRESSION: 1.  Indeterminate bilateral pulmonary nodules, the largest of which within the left mid lung measures approximately 1.3 x 1.1 cm.  This finding is also associated with possible mild nodularity of the pulmonary hila which may represent hilar lymphadenopathy.  Comparison to prior examinations (if available) is recommended. Otherwise, further evaluation with contrast enhanced chest CT is recommended.  2.  Minimal left basilar/retrocardiac opacities - atelectasis versus infiltrate.  This was made a call report.   Original Report Authenticated By: Tacey Ruiz, MD   Ct Chest Wo Contrast  02/10/2013   *RADIOLOGY REPORT*  Clinical Data: Emesis, diarrhea, shortness of breath, body ache, chills, abnormal chest radiograph  CT CHEST WITHOUT CONTRAST  Technique:  Multidetector CT imaging of the chest was performed following the standard protocol without IV contrast.  Comparison: Chest radiograph of earlier same day  Findings:  There is an approximately 1.1 x 0.7 cm nodule within the left upper lobe which correlates with the dominant nodule seen on chest radiograph.  There are scattered multiple additional bilateral sub centimeter pulmonary nodules, the largest of which within the subpleural aspect of the superior segment right lower lobe measuring 8 mm in diameter (image 29, series 3).  No definite mediastinal, hilar or axillary lymphadenopathy on this noncontrast examination. Shoddy periaortic lymph nodes are seen along the descending thoracic aorta, individually not enlarged by CT criteria with index lymph node measuring 7 mm in diameter (image 51, series 2).  Minimal dependent subpleural ground-glass atelectasis within the bilateral lower lobes.  No focal airspace opacities.  No pleural effusion or pneumothorax.  The central pulmonary airways are widely patent.  Normal heart size.  Extensive coronary artery calcifications.  No pericardial effusion. Scattered atherosclerotic plaque within a normal caliber thoracic aorta.  Normal caliber of the main pulmonary artery.  Noncontrast evaluation of the upper abdomen demonstrates a sequela of prior cholecystectomy.  The pancreas is largely fatty replaced. There is a suspected approximately 2.7 cm partially exophytic hypoattenuating  lesion arising from the superior pole left kidney (image 63, series 2) which is incompletely imaged though favored to represent a renal cyst.  No acute or aggressive osseous abnormalities.  A bone island is noted within the anterior aspect of the right sixth rib (image 39, series 2).  IMPRESSION: 1.  Indeterminate bilateral scattered pulmonary nodules, the largest of which measuring approximately 1.1 cm within the left upper lobe correlates with the dominant nodule seen on preceding chest radiograph.  Again, comparison to remote prior examinations is recommended.  If no comparisons exist, follow-up chest CT at 3-6 months is recommended.  This recommendation follows the consensus statement: Guidelines for Management of Small Pulmonary Nodules Detected on CT Scans: A Statement from the Fleischner Society as published in Radiology 2005; 237:395-400.  2.  No evidence of pneumonia to explain the etiology of the patient's body aches and chills  3.  Coronary artery calcifications.  3.  Approximately 2.7 cm partially exophytic hypoattenuating lesion arising from the superior pole of the left kidney, incompletely imaged and evaluated on this noncontrast examination.  Further evaluation with non  emergent renal ultrasound may be performed if clinically indicated.   Original Report Authenticated By: Tacey Ruiz, MD   Microbiology: Recent Results (from the past 240 hour(s))  CULTURE, BLOOD (ROUTINE X 2)     Status: None   Collection Time    02/10/13 10:50 AM      Result Value Range Status   Specimen Description BLOOD RIGHT ANTECUBITAL   Final   Special Requests BOTTLES DRAWN AEROBIC ONLY 10CC   Final   Culture  Setup Time     Final   Value: 02/10/2013 15:35     Performed at [facility]   Culture     Final   Value:        BLOOD CULTURE RECEIVED NO GROWTH TO DATE CULTURE WILL BE HELD FOR 5 DAYS BEFORE ISSUING A FINAL NEGATIVE REPORT     Performed at [facility]   Report Status PENDING    Incomplete  CULTURE, BLOOD (ROUTINE X 2)     Status: None   Collection Time    02/10/13 10:58 AM      Result Value Range Status   Specimen Description BLOOD HAND LEFT   Final   Special Requests BOTTLES DRAWN AEROBIC ONLY 5CC   Final   Culture  Setup Time     Final   Value: 02/10/2013 15:35     Performed at [facility]   Culture     Final   Value:        BLOOD CULTURE RECEIVED NO GROWTH TO DATE CULTURE WILL BE HELD FOR 5 DAYS BEFORE ISSUING A FINAL NEGATIVE REPORT     Performed at [facility]   Report Status PENDING   Incomplete     Labs: Basic Metabolic Panel:  Recent Labs Lab 02/10/13 0953 02/11/13 0618  NA 140 142  K 3.8 4.3  CL 104 111  CO2 24 25  GLUCOSE 165* 102*  BUN 22 20  CREATININE 1.37* 1.12  CALCIUM 9.2 8.0*   Liver Function Tests:  Recent Labs Lab 02/10/13 0953  AST 22  ALT 13  ALKPHOS 51  BILITOT 0.7  PROT 6.7  ALBUMIN 3.5    Recent Labs Lab 02/10/13 0953  LIPASE 20   No results found for this basename: AMMONIA,  in the last 168 hours CBC:  Recent Labs Lab 02/10/13 0953 02/11/13 0618  WBC 14.0* 9.2  NEUTROABS 12.4*  --   HGB 15.2 12.0*  HCT 41.2 34.7*  MCV 86.9 88.1  PLT 173 139*   Cardiac Enzymes: No results found for this basename: CKTOTAL, CKMB, CKMBINDEX, TROPONINI,  in the last 168 hours BNP: BNP (last 3 results) No results found for this basename: PROBNP,  in the last 8760 hours CBG: No results found for this basename: GLUCAP,  in the last 168 hours   Signed:  Analysia Dungee K  [facility] 02/11/2013, 12:49 PM

## 2013-02-12 LAB — GI PATHOGEN PANEL BY PCR, STOOL
Cryptosporidium by PCR: NEGATIVE
E coli 0157 by PCR: NEGATIVE
G lamblia by PCR: NEGATIVE
Rotavirus A by PCR: POSITIVE
Salmonella by PCR: NEGATIVE
Shigella by PCR: NEGATIVE

## 2013-02-16 LAB — CULTURE, BLOOD (ROUTINE X 2): Culture: NO GROWTH

## 2013-03-26 ENCOUNTER — Telehealth: Payer: Self-pay | Admitting: Cardiovascular Disease

## 2013-03-26 MED ORDER — NITROGLYCERIN 0.4 MG SL SUBL: 0.4000 mg | SUBLINGUAL_TABLET | SUBLINGUAL | Status: DC | PRN

## 2013-03-26 NOTE — Telephone Encounter (Signed)
Patient needs a new RX for Nytrogylcrin called into [facility].

## 2013-03-26 NOTE — Telephone Encounter (Signed)
Rx sent to pharmacy   

## 2013-05-26 ENCOUNTER — Ambulatory Visit (INDEPENDENT_AMBULATORY_CARE_PROVIDER_SITE_OTHER): Payer: BC Managed Care – PPO | Admitting: Cardiovascular Disease

## 2013-05-26 ENCOUNTER — Encounter: Payer: Self-pay | Admitting: Cardiovascular Disease

## 2013-05-26 VITALS — BP 102/70 | HR 68 | Ht 71.0 in | Wt 177.8 lb

## 2013-05-26 DIAGNOSIS — I2510 Atherosclerotic heart disease of native coronary artery without angina pectoris: Secondary | ICD-10-CM

## 2013-05-26 DIAGNOSIS — E785 Hyperlipidemia, unspecified: Secondary | ICD-10-CM

## 2013-05-26 LAB — HEPATIC FUNCTION PANEL
ALT: 13 U/L (ref 0–53)
AST: 24 U/L (ref 0–37)
Alkaline Phosphatase: 68 U/L (ref 39–117)
Bilirubin, Direct: 0.2 mg/dL (ref 0.0–0.3)
Total Protein: 7.1 g/dL (ref 6.0–8.3)

## 2013-05-26 LAB — LIPID PANEL
Cholesterol: 120 mg/dL (ref 0–200)
LDL Cholesterol: 52 mg/dL (ref 0–99)
Triglycerides: 162 mg/dL — ABNORMAL HIGH (ref 0.0–149.0)

## 2013-05-26 NOTE — Patient Instructions (Signed)
Your physician wants you to follow-up in:  12 months.  You will receive a reminder letter in the mail two months in advance. If you don't receive a letter, please call our office to schedule the follow-up appointment.  We will call you with the results of lab work done today 

## 2013-05-26 NOTE — Progress Notes (Signed)
History of Present Illness: 67 yo male with history of CAD here today for cardiac f/u. He has been followed in the past by Dr. Juanda Chance. He had a remote anterior MI in 1993 and his last catheterization was in 2003 at which time he had total occlusion of the RCA. His ejection fraction was 51%. Stress myoview December 2011 with LVEF 54%, low uptake anterior wall and apex c/w scar. No evidence of ischemia.   He has been doing well. No chest pain, SOB, palpitations, near syncope or syncope. He works at [employer] on [employer] and lifts heavy objects daily wihout problems.   Primary Care Physician: None  Last Lipid Profile:Lipid Panel     Component Value Date/Time   CHOL 114 06/08/2012 0736   TRIG 94.0 06/08/2012 0736   HDL 38.80* 06/08/2012 0736   CHOLHDL 3 06/08/2012 0736   VLDL 18.8 06/08/2012 0736   LDLCALC 56 06/08/2012 0736    Past Medical History  Diagnosis Date  . Coronary artery disease     MI in 1993, cath 2003 with occluded RCA  . Hyperlipidemia     Past Surgical History  Procedure Laterality Date  . Cholecystectomy    . Appendectomy      Current Outpatient Prescriptions  Medication Sig Dispense Refill  . aspirin 81 MG tablet Take 81 mg by mouth daily.        Marland Kitchen diltiazem (CARDIZEM CD) 120 MG 24 hr capsule Take 1 capsule (120 mg total) by mouth daily.  90 capsule  3  . isosorbide mononitrate (IMDUR) 60 MG 24 hr tablet Take 0.5 tablets (30 mg total) by mouth daily.  45 tablet  1  . metoprolol (LOPRESSOR) 50 MG tablet Take 1 tablet (50 mg total) by mouth 2 (two) times daily.  180 tablet  3  . nitroGLYCERIN (NITROSTAT) 0.4 MG SL tablet Place 1 tablet (0.4 mg total) under the tongue every 5 (five) minutes as needed for chest pain.  25 tablet  3  . simvastatin (ZOCOR) 40 MG tablet Take 1 tablet (40 mg total) by mouth every evening.  90 tablet  3   No current facility-administered medications for this visit.    No Known Allergies  History   Social History  . Marital  Status: Married    Spouse Name: N/A    Number of Children: 2  . Years of Education: N/A   Occupational History  . [employer]   Social History Main Topics  . Smoking status: Former Smoker -- 2.00 packs/day for 25 years    Types: Cigarettes    Quit date: 06/11/1991  . Smokeless tobacco: Not on file  . Alcohol Use: No  . Drug Use: No  . Sexual Activity: Not on file   Other Topics Concern  . Not on file   Social History Narrative  . No narrative on file    Family History  Problem Relation Age of Onset  . Heart attack Father   . Heart attack Brother     Review of Systems:  As stated in the HPI and otherwise negative.   BP 102/70  Pulse 68  Ht 5\' 11"  (1.803 m)  Wt 177 lb 12.8 oz (80.65 kg)  BMI 24.81 kg/m2  Physical Examination: General: Well developed, well nourished, NAD HEENT: OP clear, mucus membranes moist SKIN: warm, dry. No rashes. Neuro: No focal deficits Musculoskeletal: Muscle strength 5/5 all ext Psychiatric: Mood and affect normal Neck: No JVD, no carotid bruits, no thyromegaly, no lymphadenopathy.  Lungs:Clear bilaterally, no wheezes, rhonci, crackles Cardiovascular: Huston Foley with regular rhythm. No murmurs, gallops or rubs. Abdomen:Soft. Bowel sounds present. Non-tender.  Extremities: No lower extremity edema. Pulses are 2 + in the bilateral DP/PT.  Assessment and Plan:   1. CAD, NATIVE VESSEL: Stable. Continue ASA, beta blocker, statin.   2. HYPERLIPIDEMIA: Well controlled last check in December 2012. Continue statin. Update lipids and LFTs.

## 2013-08-11 ENCOUNTER — Other Ambulatory Visit: Payer: Self-pay

## 2013-08-11 DIAGNOSIS — E785 Hyperlipidemia, unspecified: Secondary | ICD-10-CM

## 2013-08-11 DIAGNOSIS — I2510 Atherosclerotic heart disease of native coronary artery without angina pectoris: Secondary | ICD-10-CM

## 2013-08-11 MED ORDER — METOPROLOL TARTRATE 50 MG PO TABS: 50.0000 mg | ORAL_TABLET | Freq: Two times a day (BID) | ORAL | Status: DC

## 2013-08-11 MED ORDER — SIMVASTATIN 40 MG PO TABS: 40.0000 mg | ORAL_TABLET | Freq: Every evening | ORAL | Status: DC

## 2013-08-11 MED ORDER — DILTIAZEM HCL ER COATED BEADS 120 MG PO CP24: 120.0000 mg | ORAL_CAPSULE | Freq: Every day | ORAL | Status: DC

## 2013-08-11 MED ORDER — ISOSORBIDE MONONITRATE ER 60 MG PO TB24: 30.0000 mg | ORAL_TABLET | Freq: Every day | ORAL | Status: DC

## 2014-05-09 ENCOUNTER — Other Ambulatory Visit: Payer: Self-pay | Admitting: *Deleted

## 2014-05-26 ENCOUNTER — Ambulatory Visit (INDEPENDENT_AMBULATORY_CARE_PROVIDER_SITE_OTHER): Payer: BC Managed Care – PPO | Admitting: Cardiovascular Disease

## 2014-05-26 ENCOUNTER — Encounter: Payer: Self-pay | Admitting: Cardiovascular Disease

## 2014-05-26 VITALS — BP 120/78 | HR 61 | Ht 71.0 in | Wt 168.0 lb

## 2014-05-26 DIAGNOSIS — E785 Hyperlipidemia, unspecified: Secondary | ICD-10-CM

## 2014-05-26 DIAGNOSIS — R911 Solitary pulmonary nodule: Secondary | ICD-10-CM

## 2014-05-26 DIAGNOSIS — I2510 Atherosclerotic heart disease of native coronary artery without angina pectoris: Secondary | ICD-10-CM

## 2014-05-26 DIAGNOSIS — R1084 Generalized abdominal pain: Secondary | ICD-10-CM

## 2014-05-26 LAB — HEPATIC FUNCTION PANEL
ALT: 14 U/L (ref 0–53)
AST: 27 U/L (ref 0–37)
Albumin: 4.3 g/dL (ref 3.5–5.2)
Alkaline Phosphatase: 70 U/L (ref 39–117)
BILIRUBIN DIRECT: 0 mg/dL (ref 0.0–0.3)
TOTAL PROTEIN: 7.4 g/dL (ref 6.0–8.3)
Total Bilirubin: 0.7 mg/dL (ref 0.2–1.2)

## 2014-05-26 LAB — BASIC METABOLIC PANEL
BUN: 13 mg/dL (ref 6–23)
CALCIUM: 9.3 mg/dL (ref 8.4–10.5)
CO2: 26 mEq/L (ref 19–32)
Chloride: 105 mEq/L (ref 96–112)
Creatinine, Ser: 1.2 mg/dL (ref 0.4–1.5)
GFR: 62.61 mL/min (ref >60.00)
Glucose, Bld: 97 mg/dL (ref 70–99)
POTASSIUM: 4.1 meq/L (ref 3.5–5.1)
Sodium: 139 mEq/L (ref 135–145)

## 2014-05-26 LAB — LIPID PANEL
CHOLESTEROL: 126 mg/dL (ref 0–200)
HDL: 36.3 mg/dL — ABNORMAL LOW (ref >39.00)
LDL CALC: 66 mg/dL (ref 0–99)
NonHDL: 89.7
TRIGLYCERIDES: 117 mg/dL (ref 0.0–149.0)
Total CHOL/HDL Ratio: 3
VLDL: 23.4 mg/dL (ref 0.0–40.0)

## 2014-05-26 NOTE — Patient Instructions (Addendum)
Your physician wants you to follow-up in: 12 months. You will receive a reminder letter in the mail two months in advance. If you don't receive a letter, please call our office to schedule the follow-up appointment.  Non-Cardiac CT scanning, (CAT scanning), is a noninvasive, special x-ray that produces cross-sectional images of the body using x-rays and a computer. CT scans help physicians diagnose and treat medical conditions. For some CT exams, a contrast material is used to enhance visibility in the area of the body being studied. CT scans provide greater clarity and reveal more details than regular x-ray exams.  CT of chest, abdomen and pelvis  Your physician has requested that you have an exercise stress myoview. For further information please visit [email or web address]. Please follow instruction sheet, as given.  Lab work to be done today--BMP, Lipid and Liver profiles.

## 2014-05-26 NOTE — Progress Notes (Signed)
History of Present Illness: 68 yo male with history of CAD and HLD here today for cardiac f/u. He has been followed in the past by Dr. Juanda ChanceBrodie. He had a remote anterior MI in 1993 and his last catheterization was in 2003 at which time he had total occlusion of the RCA. His ejection fraction was 51%. Stress myoview December 2011 with LVEF 54%, low uptake anterior wall and apex c/w scar. No evidence of ischemia.   He has been doing well. No chest pain, SOB, palpitations, near syncope or syncope. He describes bloating and abdominal pain. Was admitted with same pain in September 2014 and had a CT abdomen which showed a renal mass. No follow up since then. No primary care doctor. He works at [employer] on [address] and lifts heavy objects daily wihout problems.   Primary Care Physician: None  Last Lipid Profile:Lipid Panel     Component Value Date/Time   CHOL 120 05/26/2013 1455   TRIG 162.0* 05/26/2013 1455   HDL 36.10* 05/26/2013 1455   CHOLHDL 3 05/26/2013 1455   VLDL 32.4 05/26/2013 1455   LDLCALC 52 05/26/2013 1455    Past Medical History  Diagnosis Date  . Coronary artery disease     MI in 1993, cath 2003 with occluded RCA  . Hyperlipidemia     Past Surgical History  Procedure Laterality Date  . Cholecystectomy    . Appendectomy      Current Outpatient Prescriptions  Medication Sig Dispense Refill  . aspirin 81 MG tablet Take 81 mg by mouth daily.      Marland Kitchen. diltiazem (CARDIZEM CD) 120 MG 24 hr capsule Take 1 capsule (120 mg total) by mouth daily. 90 capsule 3  . isosorbide mononitrate (IMDUR) 60 MG 24 hr tablet Take 0.5 tablets (30 mg total) by mouth daily. 45 tablet 3  . metoprolol (LOPRESSOR) 50 MG tablet Take 1 tablet (50 mg total) by mouth 2 (two) times daily. 180 tablet 3  . nitroGLYCERIN (NITROSTAT) 0.4 MG SL tablet Place 1 tablet (0.4 mg total) under the tongue every 5 (five) minutes as needed for chest pain. 25 tablet 3  . simvastatin (ZOCOR) 40 MG tablet Take 1 tablet (40  mg total) by mouth every evening. 90 tablet 3   No current facility-administered medications for this visit.    No Known Allergies  History   Social History  . Marital Status: Married    Spouse Name: N/A    Number of Children: 2  . Years of Education: N/A   Occupational History  . [employer]   Social History Main Topics  . Smoking status: Former Smoker -- 2.00 packs/day for 25 years    Types: Cigarettes    Quit date: 06/11/1991  . Smokeless tobacco: Not on file  . Alcohol Use: No  . Drug Use: No  . Sexual Activity: Not on file   Other Topics Concern  . Not on file   Social History Narrative    Family History  Problem Relation Age of Onset  . Heart attack Father   . Heart attack Brother     Review of Systems:  As stated in the HPI and otherwise negative.   BP 120/78 mmHg  Pulse 61  Ht 5\' 11"  (1.803 m)  Wt 168 lb (76.204 kg)  BMI 23.44 kg/m2  SpO2 97%  Physical Examination: General: Well developed, well nourished, NAD HEENT: OP clear, mucus membranes moist SKIN: warm, dry. No rashes. Neuro: No focal deficits Musculoskeletal: Muscle  strength 5/5 all ext Psychiatric: Mood and affect normal Neck: No JVD, no carotid bruits, no thyromegaly, no lymphadenopathy. Lungs:Clear bilaterally, no wheezes, rhonci, crackles Cardiovascular: Huston FoleyBrady with regular rhythm. No murmurs, gallops or rubs. Abdomen:Soft. Bowel sounds present. Non-tender.  Extremities: No lower extremity edema. Pulses are 2 + in the bilateral DP/PT.  EKG: NSR, rate 61 bpm.   Assessment and Plan:   1. CAD, NATIVE VESSEL: Stable. Continue ASA, beta blocker, statin, Imdur. Will arrange exercise stress myoview since he has had no ischemic testing in 4 years.   2. HYPERLIPIDEMIA: Well controlled last check in December 2014. Continue statin. Update lipids and LFTs today.  3. Abdominal pain/renal mass: Renal mass by CT abd in 2014 but no follow up since then. [facility]  he was admitted to the hospitalist service.  Will arrange CT abd/pelvis to assess. He is asked to establish with primary care.

## 2014-05-31 ENCOUNTER — Encounter (HOSPITAL_COMMUNITY): Payer: BC Managed Care – PPO

## 2014-06-02 ENCOUNTER — Encounter: Payer: Self-pay | Admitting: Cardiology

## 2014-06-13 ENCOUNTER — Encounter (HOSPITAL_COMMUNITY): Payer: BLUE CROSS/BLUE SHIELD | Attending: Cardiology | Admitting: Radiology

## 2014-06-13 DIAGNOSIS — I2510 Atherosclerotic heart disease of native coronary artery without angina pectoris: Secondary | ICD-10-CM | POA: Insufficient documentation

## 2014-06-13 MED ORDER — TECHNETIUM TC 99M SESTAMIBI GENERIC - CARDIOLITE
30.0000 | Freq: Once | INTRAVENOUS | Status: AC | PRN
  Administered 2014-06-13: 30 via INTRAVENOUS

## 2014-06-13 MED ORDER — TECHNETIUM TC 99M SESTAMIBI GENERIC - CARDIOLITE
10.0000 | Freq: Once | INTRAVENOUS | Status: AC | PRN
  Administered 2014-06-13: 10 via INTRAVENOUS

## 2014-06-13 NOTE — Progress Notes (Signed)
[facility] SITE 3 NUCLEAR MED [address] [place], [place] [address] [phone]    Cardiology Nuclear Med Study  Jeffrey Lyons is a 69 y.o. male     MRN : [id number]     DOB: 04-03-1946  Procedure Date: 06/13/2014  Nuclear Med Background Indication for Stress Test:  Evaluation for Ischemia and Follow up CAD History:  CAD-PTCA, '11 MPI: EF: 54% (-) ischemia Cardiac Risk Factors: none  Symptoms:  n/a   Nuclear Pre-Procedure Caffeine/Decaff Intake:  None NPO After: 7:00pm   Lungs:  clear O2 Sat: 96% on room air. IV 0.9% NS with Angio Cath:  20g  IV Site: R Hand  IV Started by:  Cathlyn Parsons, RN  Chest Size (in):  42 Cup Size: n/a  Height:  (1.803 m)  Weight:  184 lb (83.462 kg)  BMI:  Body mass index is 25.67 kg/(m^2). Tech Comments:  Patient took Lopressor at 1800 last pm.    Nuclear Med Study 1 or 2 day study: 1 day  Stress Test Type:  Stress  Reading MD: n/a  Order Authorizing Provider:  Tedra Senegal  Resting Radionuclide: Technetium 4m Sestamibi  Resting Radionuclide Dose: 11.0 mCi   Stress Radionuclide:  Technetium 47m Sestamibi  Stress Radionuclide Dose: 33.0 mCi           Stress Protocol Rest HR: 51 Stress HR: 142  Rest BP: 134/79 Stress BP: 195/104  Exercise Time (min): 11:00 METS: 13.30   Predicted Max HR: 152 bpm % Max HR: 93.42 bpm Rate Pressure Product: 01093   Dose of Adenosine (mg):  n/a Dose of Lexiscan: n/a mg  Dose of Atropine (mg): n/a Dose of Dobutamine: n/a mcg/kg/min (at max HR)  Stress Test Technologist: Milana Na, EMT-P  Nuclear Technologist:  Kerby Nora, CNMT     Rest Procedure:  Myocardial perfusion imaging was performed at rest 45 minutes following the intravenous administration of Technetium 40m Sestamibi. Rest ECG: NSR - Normal EKG  Stress Procedure:  The patient exercised on the treadmill utilizing the Bruce Protocol for 11:00 minutes. The patient stopped due to fatigue and denied any  chest pain.  Technetium 64m Sestamibi was injected at peak exercise and myocardial perfusion imaging was performed after a brief delay. Stress ECG: 1 mm ST depression at peak exercise in inferolateral leads.  QPS Raw Data Images:  Normal; no motion artifact; normal heart/lung ratio. Stress Images:  There is decreased uptake in the apex. Rest Images:  There is decreased uptake in the apex. Subtraction (SDS):  There is a fixed defect that is most consistent with a previous infarction. Transient Ischemic Dilatation (Normal <1.22):  0.83 Lung/Heart Ratio (Normal <0.45):  0.33  Quantitative Gated Spect Images QGS EDV:  106 ml QGS ESV:  54 ml  Impression Exercise Capacity:  Excellent exercise capacity. BP Response:  Normal blood pressure response. Clinical Symptoms:  No chest pain. ECG Impression:  1 mm ST depression in inferolateral leads at peak exercise. Comparison with Prior Nuclear Study: No significant change from previous study  Overall Impression:  Low risk stress nuclear study.  There is evidence of a medium sized, moderate intensity fixed defect of the apex and anteroapical wall. There is no reversible ischemia. Good exercise tolerance.  LV Ejection Fraction: 49%.  LV Wall Motion:  Mild apical hypokinesis.  Cassell Clement MD

## 2014-06-20 ENCOUNTER — Other Ambulatory Visit: Payer: Medicare Other

## 2014-06-20 ENCOUNTER — Other Ambulatory Visit: Payer: BC Managed Care – PPO

## 2014-06-20 ENCOUNTER — Ambulatory Visit (INDEPENDENT_AMBULATORY_CARE_PROVIDER_SITE_OTHER)
Admission: RE | Admit: 2014-06-20 | Discharge: 2014-06-20 | Disposition: A | Discharge status: Home / Self Care | Payer: BLUE CROSS/BLUE SHIELD | Source: Ambulatory Visit | Attending: Cardiovascular Disease | Admitting: Cardiovascular Disease

## 2014-06-20 ENCOUNTER — Inpatient Hospital Stay: Admission: RE | Admit: 2014-06-20 | Payer: BC Managed Care – PPO | Source: Ambulatory Visit

## 2014-06-20 DIAGNOSIS — R1084 Generalized abdominal pain: Secondary | ICD-10-CM

## 2014-06-20 DIAGNOSIS — R911 Solitary pulmonary nodule: Secondary | ICD-10-CM

## 2014-06-20 MED ORDER — IOHEXOL 350 MG/ML SOLN
100.0000 mL | Freq: Once | INTRAVENOUS | Status: AC | PRN
  Administered 2014-06-20: 100 mL via INTRAVENOUS

## 2014-08-07 ENCOUNTER — Other Ambulatory Visit: Payer: Self-pay | Admitting: Cardiovascular Disease

## 2015-01-31 ENCOUNTER — Other Ambulatory Visit: Payer: Self-pay | Admitting: Cardiovascular Disease

## 2015-05-03 ENCOUNTER — Other Ambulatory Visit: Payer: Self-pay | Admitting: Cardiovascular Disease

## 2015-06-13 ENCOUNTER — Encounter: Payer: Self-pay | Admitting: Cardiovascular Disease

## 2015-06-13 ENCOUNTER — Ambulatory Visit (INDEPENDENT_AMBULATORY_CARE_PROVIDER_SITE_OTHER): Payer: BLUE CROSS/BLUE SHIELD | Admitting: Cardiovascular Disease

## 2015-06-13 VITALS — BP 116/80 | HR 52 | Ht 71.0 in | Wt 186.8 lb

## 2015-06-13 DIAGNOSIS — R911 Solitary pulmonary nodule: Secondary | ICD-10-CM | POA: Diagnosis not present

## 2015-06-13 DIAGNOSIS — I2510 Atherosclerotic heart disease of native coronary artery without angina pectoris: Secondary | ICD-10-CM

## 2015-06-13 DIAGNOSIS — E785 Hyperlipidemia, unspecified: Secondary | ICD-10-CM | POA: Diagnosis not present

## 2015-06-13 LAB — HEPATIC FUNCTION PANEL
ALK PHOS: 57 U/L (ref 40–115)
ALT: 16 U/L (ref 9–46)
AST: 21 U/L (ref 10–35)
Albumin: 3.8 g/dL (ref 3.6–5.1)
BILIRUBIN INDIRECT: 0.4 mg/dL (ref 0.2–1.2)
Bilirubin, Direct: 0.1 mg/dL (ref <=0.2)
TOTAL PROTEIN: 6.6 g/dL (ref 6.1–8.1)
Total Bilirubin: 0.5 mg/dL (ref 0.2–1.2)

## 2015-06-13 LAB — LIPID PANEL
CHOLESTEROL: 119 mg/dL — AB (ref 125–200)
HDL: 39 mg/dL — ABNORMAL LOW (ref >=40)
LDL Cholesterol: 56 mg/dL (ref <130)
TRIGLYCERIDES: 118 mg/dL (ref <150)
Total CHOL/HDL Ratio: 3.1 Ratio (ref <=5.0)
VLDL: 24 mg/dL (ref <30)

## 2015-06-13 NOTE — Patient Instructions (Addendum)
Medication Instructions:  Your physician recommends that you continue on your current medications as directed. Please refer to the Current Medication list given to you today.   Labwork: Lab work to be done today--Lipid and liver profiles  Testing/Procedures: none  Follow-Up: Your physician wants you to follow-up in: 12 months.  You will receive a reminder letter in the mail two months in advance. If you don't receive a letter, please call our office to schedule the follow-up appointment.   Any Other Special Instructions Will Be Listed Below (If Applicable).     If you need a refill on your cardiac medications before your next appointment, please call your pharmacy.   

## 2015-06-13 NOTE — Progress Notes (Signed)
Chief Complaint  Patient presents with  . Follow-up     History of Present Illness: 70 yo male with history of CAD and HLD here today for cardiac f/u. He has been followed in the past by Dr. Juanda Chance. He had a remote anterior MI in 1993 and his last catheterization was in 2003 at which time he had total occlusion of the RCA. Stress myoview January 2016 with chronic scar at the apex but no ischemia. LVEF=49%.   He has been doing well. No chest pain, SOB, palpitations, near syncope or syncope. He describes bloating and abdominal pain. Was admitted with same pain in September 2014 and had a CT abdomen which showed a renal mass. No primary care doctor. He works at [employer] on [employer] and lifts heavy objects daily wihout problems.   Primary Care Physician: None   Past Medical History  Diagnosis Date  . Coronary artery disease     MI in 1993, cath 2003 with occluded RCA  . Hyperlipidemia     Past Surgical History  Procedure Laterality Date  . Cholecystectomy    . Appendectomy      Current Outpatient Prescriptions  Medication Sig Dispense Refill  . aspirin 81 MG tablet Take 81 mg by mouth daily.      Marland Kitchen diltiazem (CARDIZEM CD) 120 MG 24 hr capsule TAKE ONE CAPSULE BY MOUTH EVERY DAY 90 capsule 3  . isosorbide mononitrate (IMDUR) 60 MG 24 hr tablet TAKE 1/2 TABLET BY MOUTH DAILY 45 tablet 3  . metoprolol (LOPRESSOR) 50 MG tablet TAKE 1 TABLET BY MOUTH TWICE A DAY 180 tablet 3  . nitroGLYCERIN (NITROSTAT) 0.4 MG SL tablet Place 1 tablet (0.4 mg total) under the tongue every 5 (five) minutes as needed for chest pain. 25 tablet 3  . simvastatin (ZOCOR) 40 MG tablet TAKE 1 TABLET BY MOUTH EVERY EVENING 90 tablet 3   No current facility-administered medications for this visit.    No Known Allergies  Social History   Social History  . Marital Status: Married    Spouse Name: N/A  . Number of Children: 2  . 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 Smoking status: Former Smoker -- 2.00 packs/day for 25 years    Types: Cigarettes    Quit date: 06/11/1991  . Smokeless tobacco: Not on file  . Alcohol Use: No  . Drug Use: No  . Sexual Activity: Not on file   Other Topics Concern  . Not on file   Social History Narrative    Family History  Problem Relation Age of Onset  . Heart attack Father   . Heart attack Brother     Review of Systems:  As stated in the HPI and otherwise negative.   BP 116/80 mmHg  Pulse 52  Ht 5\' 11"  (1.803 m)  Wt 186 lb 12.8 oz (84.732 kg)  BMI 26.06 kg/m2  Physical Examination: General: Well developed, well nourished, NAD HEENT: OP clear, mucus membranes moist SKIN: warm, dry. No rashes. Neuro: No focal deficits Musculoskeletal: Muscle strength 5/5 all ext Psychiatric: Mood and affect normal Neck: No JVD, no carotid bruits, no thyromegaly, no lymphadenopathy. Lungs:Clear bilaterally, no wheezes, rhonci, crackles Cardiovascular: Huston Foley with regular rhythm. No murmurs, gallops or rubs. Abdomen:Soft. Bowel sounds present. Non-tender.  Extremities: No lower extremity edema. Pulses are 2 + in the bilateral DP/PT.  EKG:  EKG is ordered today. The ekg ordered today  demonstrates sinus brady, rate 52 bpm.   Recent Labs: No results found for requested labs within last 365 days.   Lipid Panel    Component Value Date/Time   CHOL 126 05/26/2014 0913   TRIG 117.0 05/26/2014 0913   HDL 36.30* 05/26/2014 0913   CHOLHDL 3 05/26/2014 0913   VLDL 23.4 05/26/2014 0913   LDLCALC 66 05/26/2014 0913     Wt Readings from Last 3 Encounters:  06/13/15 186 lb 12.8 oz (84.732 kg)  06/13/14 184 lb (83.462 kg)  05/26/14 168 lb (76.204 kg)     Other studies Reviewed: Additional studies/ records that were reviewed today include: . Review of the above records demonstrates:    Assessment and Plan:   1. CAD, NATIVE VESSEL: Stable. Continue ASA, beta blocker, statin, Imdur. Stress  myoview January 2016 without ischemia.    2. HYPERLIPIDEMIA: Well controlled last check in December 2015. Continue statin. Update lipids and LFTs today.  3. Pulmonary nodules/renal mass: Renal mass and pulmonary nodules by CT chest/abd in 2014 and repeat in January 2016 showed stability. Recommendations made for follow up Sept 2016 but he has still not established in primary care. I discussed repeating today but he wishes to wait due to cost. I have explained that it should be done this year. He is asked to establish with primary care.   Current medicines are reviewed at length with the patient today.  The patient does not have concerns regarding medicines.  The following changes have been made:  no change  Labs/ tests ordered today include:   Orders Placed This Encounter  Procedures  . Lipid Profile  . Hepatic function panel  . EKG 12-Lead     Disposition:   FU with me in 12  months   Signed, Verne Carrowhristopher McAlhany, MD 06/13/2015 9:23 AM    [facility] [address], [place], [place]  [address] Phone: [phone]; Fax: [phone]

## 2016-04-23 ENCOUNTER — Other Ambulatory Visit: Payer: Self-pay | Admitting: Cardiovascular Disease

## 2016-07-01 ENCOUNTER — Ambulatory Visit (INDEPENDENT_AMBULATORY_CARE_PROVIDER_SITE_OTHER): Payer: BLUE CROSS/BLUE SHIELD | Admitting: Cardiovascular Disease

## 2016-07-01 ENCOUNTER — Encounter: Payer: Self-pay | Admitting: Cardiovascular Disease

## 2016-07-01 ENCOUNTER — Encounter (INDEPENDENT_AMBULATORY_CARE_PROVIDER_SITE_OTHER): Payer: Self-pay

## 2016-07-01 VITALS — BP 112/82 | HR 54 | Ht 70.0 in | Wt 186.0 lb

## 2016-07-01 DIAGNOSIS — E7800 Pure hypercholesterolemia, unspecified: Secondary | ICD-10-CM | POA: Diagnosis not present

## 2016-07-01 DIAGNOSIS — I2510 Atherosclerotic heart disease of native coronary artery without angina pectoris: Secondary | ICD-10-CM | POA: Diagnosis not present

## 2016-07-01 NOTE — Patient Instructions (Addendum)
Medication Instructions:  Your physician recommends that you continue on your current medications as directed. Please refer to the Current Medication list given to you today.   Labwork: Your physician recommends that you return for lab work on January 29,2018 for fasting lab work.  The lab opens at 7:30 AM   Testing/Procedures: none  Follow-Up: Your physician recommends that you schedule a follow-up appointment in: 12 months.  Please call our office in about 9 months to schedule this appointment.     Any Other Special Instructions Will Be Listed Below (If Applicable).     If you need a refill on your cardiac medications before your next appointment, please call your pharmacy.

## 2016-07-01 NOTE — Progress Notes (Signed)
Chief Complaint  Patient presents with  . Annual Exam     History of Present Illness: 71 yo male with history of CAD and HLD here today for cardiac f/u. He had a remote anterior MI in 1993 and his last catheterization was in 2003 at which time he had total occlusion of the RCA. Stress myoview January 2016 with chronic scar at the apex but no ischemia. LVEF=49%. He has had pulmonary nodules and renal cysts noted on CT scans in 2014. I arranged testing in 2016 which was stable. He has refused repeat testing since then due to cost. He has also not gotten a primary care doctor as advised.   He has been doing well. No chest pain, SOB, palpitations, near syncope or syncope. He is very active.   Primary Care Physician: No primary care provider on file.  Past Medical History:  Diagnosis Date  . Coronary artery disease    MI in 1993, cath 2003 with occluded RCA  . Hyperlipidemia     Past Surgical History:  Procedure Laterality Date  . APPENDECTOMY    . CHOLECYSTECTOMY      Current Outpatient Prescriptions  Medication Sig Dispense Refill  . aspirin 81 MG tablet Take 81 mg by mouth daily.      Marland Kitchen diltiazem (CARDIZEM CD) 120 MG 24 hr capsule TAKE ONE CAPSULE BY MOUTH EVERY DAY 90 capsule 0  . isosorbide mononitrate (IMDUR) 60 MG 24 hr tablet TAKE 1/2 TABLET BY MOUTH DAILY 45 tablet 0  . metoprolol (LOPRESSOR) 50 MG tablet TAKE 1 TABLET BY MOUTH TWICE A DAY 180 tablet 0  . nitroGLYCERIN (NITROSTAT) 0.4 MG SL tablet Place 1 tablet (0.4 mg total) under the tongue every 5 (five) minutes as needed for chest pain. 25 tablet 3  . simvastatin (ZOCOR) 40 MG tablet TAKE 1 TABLET BY MOUTH EVERY EVENING 90 tablet 0   No current facility-administered medications for this visit.     No Known Allergies  Social History   Social History  . Marital status: Married    Spouse name: N/A  . Number of children: 2  . 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 Smoking status: Former Smoker    Packs/day: 2.00    Years: 25.00    Types: Cigarettes    Quit date: 06/11/1991  . Smokeless tobacco: Never Used  . Alcohol use No  . Drug use: No  . Sexual activity: Not on file   Other Topics Concern  . Not on file   Social History Narrative  . No narrative on file    Family History  Problem Relation Age of Onset  . Heart attack Father   . Heart attack Brother     Review of Systems:  As stated in the HPI and otherwise negative.   BP 112/82   Pulse (!) 54   Ht 5' 10" (1.778 m)   Wt 186 lb (84.4 kg)   BMI 26.69 kg/m   Physical Examination: General: Well developed, well nourished, NAD  HEENT: OP clear, mucus membranes moist  SKIN: warm, dry. No rashes. Neuro: No focal deficits  Musculoskeletal: Muscle strength 5/5 all ext  Psychiatric: Mood and affect normal  Neck: No JVD, no carotid bruits, no thyromegaly, no lymphadenopathy.  Lungs:Clear bilaterally, no wheezes, rhonci, crackles Cardiovascular: Loletha Grayer with regular rhythm. No murmurs, gallops or rubs. Abdomen:Soft. Bowel sounds present. Non-tender.  Extremities: No lower extremity edema.  Pulses are 2 + in the bilateral DP/PT.  EKG:  EKG is ordered today. The ekg ordered today demonstrates Sinus bradycardia, rate 71 bpm.   Recent Labs: No results found for requested labs within last 8760 hours.   Lipid Panel    Component Value Date/Time   CHOL 119 (L) 06/13/2015 0914   TRIG 118 06/13/2015 0914   HDL 39 (L) 06/13/2015 0914   CHOLHDL 3.1 06/13/2015 0914   VLDL 24 06/13/2015 0914   LDLCALC 56 06/13/2015 0914     Wt Readings from Last 3 Encounters:  07/01/16 186 lb (84.4 kg)  06/13/15 186 lb 12.8 oz (84.7 kg)  06/13/14 184 lb (83.5 kg)     Other studies Reviewed: Additional studies/ records that were reviewed today include: . Review of the above records demonstrates:    Assessment and Plan:   1. CAD, NATIVE VESSEL: No recent chest pain suggestive of  angina. Continue ASA, beta blocker, statin, Imdur. Stress myoview January 2016 without ischemia.  Check BMET  2. HYPERLIPIDEMIA: Well controlled last check in January 2017. Continue statin. Will check lipids and LFTS.   3. Pulmonary nodules/renal mass: Renal mass and pulmonary nodules by CT chest/abd in 2014 and repeat in January 2016 showed stability. Recommendations made for follow up Sept 2016 but he cancelled due to cost. He has not established in primary care. I discussed repeating today but he wishes to wait due to cost. I have explained that it should be done this year. He is asked to establish with primary care.   Current medicines are reviewed at length with the patient today.  The patient does not have concerns regarding medicines.  The following changes have been made:  no change  Labs/ tests ordered today include:   Orders Placed This Encounter  Procedures  . Comp Met (CMET)  . Lipid Profile  . EKG 12-Lead     Disposition:   FU with me in 12  months   Signed, Lauree Chandler, MD 07/01/2016 4:30 PM    [address], [place], [place]  [address] Phone: [phone]; Fax: [phone]

## 2016-07-08 ENCOUNTER — Other Ambulatory Visit: Payer: BLUE CROSS/BLUE SHIELD | Admitting: *Deleted

## 2016-07-08 DIAGNOSIS — E7800 Pure hypercholesterolemia, unspecified: Secondary | ICD-10-CM

## 2016-07-09 LAB — COMPREHENSIVE METABOLIC PANEL
ALBUMIN: 4.1 g/dL (ref 3.5–4.8)
ALK PHOS: 80 IU/L (ref 39–117)
ALT: 14 IU/L (ref 0–44)
AST: 20 IU/L (ref 0–40)
Albumin/Globulin Ratio: 1.7 (ref 1.2–2.2)
BILIRUBIN TOTAL: 0.3 mg/dL (ref 0.0–1.2)
BUN / CREAT RATIO: 11 (ref 10–24)
BUN: 12 mg/dL (ref 8–27)
CHLORIDE: 102 mmol/L (ref 96–106)
CO2: 26 mmol/L (ref 18–29)
Calcium: 9 mg/dL (ref 8.6–10.2)
Creatinine, Ser: 1.13 mg/dL (ref 0.76–1.27)
GFR calc Af Amer: 75 mL/min/{1.73_m2} (ref >59)
GFR calc non Af Amer: 65 mL/min/{1.73_m2} (ref >59)
GLUCOSE: 105 mg/dL — AB (ref 65–99)
Globulin, Total: 2.4 g/dL (ref 1.5–4.5)
Potassium: 3.9 mmol/L (ref 3.5–5.2)
Sodium: 139 mmol/L (ref 134–144)
Total Protein: 6.5 g/dL (ref 6.0–8.5)

## 2016-07-09 LAB — LIPID PANEL
CHOLESTEROL TOTAL: 105 mg/dL (ref 100–199)
Chol/HDL Ratio: 2.8 ratio units (ref 0.0–5.0)
HDL: 37 mg/dL — ABNORMAL LOW (ref >39)
LDL Calculated: 45 mg/dL (ref 0–99)
Triglycerides: 114 mg/dL (ref 0–149)
VLDL CHOLESTEROL CAL: 23 mg/dL (ref 5–40)

## 2016-07-19 ENCOUNTER — Other Ambulatory Visit: Payer: Self-pay | Admitting: Cardiovascular Disease

## 2016-07-19 ENCOUNTER — Telehealth: Payer: Self-pay | Admitting: Cardiovascular Disease

## 2016-07-19 NOTE — Telephone Encounter (Signed)
New Message ° °Pt voiced returning nurses call. ° °Please f/u °

## 2016-07-19 NOTE — Telephone Encounter (Signed)
I spoke with pt and reviewed Lipid and CMET results with him.  Copy mailed to him at his request.

## 2016-10-21 ENCOUNTER — Other Ambulatory Visit: Payer: Self-pay | Admitting: *Deleted

## 2016-10-21 MED ORDER — NITROGLYCERIN 0.4 MG SL SUBL: 0.4000 mg | SUBLINGUAL_TABLET | SUBLINGUAL | 3 refills | Status: DC | PRN

## 2017-07-12 ENCOUNTER — Other Ambulatory Visit: Payer: Self-pay | Admitting: Cardiovascular Disease

## 2017-08-11 ENCOUNTER — Ambulatory Visit: Payer: BLUE CROSS/BLUE SHIELD | Admitting: Cardiovascular Disease

## 2017-10-09 ENCOUNTER — Other Ambulatory Visit: Payer: Self-pay | Admitting: Cardiovascular Disease

## 2017-10-10 ENCOUNTER — Other Ambulatory Visit: Payer: Self-pay | Admitting: Cardiovascular Disease

## 2017-11-10 ENCOUNTER — Ambulatory Visit: Payer: BLUE CROSS/BLUE SHIELD | Admitting: Cardiovascular Disease

## 2017-11-10 ENCOUNTER — Encounter

## 2017-12-04 ENCOUNTER — Encounter: Payer: Self-pay | Admitting: Cardiovascular Disease

## 2017-12-10 NOTE — Progress Notes (Signed)
Chief Complaint  Patient presents with  . Follow-up    CAD     History of Present Illness: 72 yo male with history of coronary artery disease and hyperlipidemia here today for cardiac follow up. He had an anterior MI in 1993. His last cardiac catheterization was in 2003 at which time he had total occlusion of the RCA. Muclear stress test in January 2016 with chronic scar at the apex but no ischemia. LVEF=49%. He has had pulmonary nodules and renal cysts noted on CT scans in 2014. Repeat CT chest/abdomen in January 2016 with stable disease. Follow up testing not completed due to cost. (Recommendation per radiology to repeat scans in September 2016 to demonstrate stability).    He is here today for follow up. The patient denies any chest pain, dyspnea, palpitations, lower extremity edema, orthopnea, PND, dizziness, near syncope or syncope. He has established primary care with [facility] in [place], [place]. He works at [employer] and is very active.   Primary Care Physician: Patient, No Pcp Per Kathryne Sharper, [facility]  Past Medical History:  Diagnosis Date  . Coronary artery disease    MI in 1993, cath 2003 with occluded RCA  . Hyperlipidemia     Past Surgical History:  Procedure Laterality Date  . APPENDECTOMY    . CHOLECYSTECTOMY      Current Outpatient Medications  Medication Sig Dispense Refill  . aspirin 81 MG tablet Take 81 mg by mouth daily.      Marland Kitchen diltiazem (CARDIZEM CD) 120 MG 24 hr capsule TAKE 1 CAPSULE BY MOUTH DAILY. 90 capsule 3  . isosorbide mononitrate (IMDUR) 30 MG 24 hr tablet Take one tablet by mouth daily. 90 tablet 3  . metoprolol tartrate (LOPRESSOR) 50 MG tablet TAKE 1 TABLET BY MOUTH 2 (TWO) TIMES DAILY. 180 tablet 3  . nitroGLYCERIN (NITROSTAT) 0.4 MG SL tablet Place 1 tablet (0.4 mg total) under the tongue every 5 (five) minutes as needed for chest pain. 25 tablet 3  . simvastatin (ZOCOR) 40 MG tablet TAKE 1 TABLET BY MOUTH EVERY EVENING. 90 tablet 3   No current  facility-administered medications for this visit.     No Known Allergies  Social History   Socioeconomic History  . Marital status: Married    Spouse name: Not on file  . Number of children: 2  . Years of education: Not on file  . Highest education level: Not on file  Occupational History  . 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 Financial resource strain: Not on file  . Food insecurity:    Worry: Not on file    Inability: Not on file  . Transportation needs:    Medical: Not on file    Non-medical: Not on file  Tobacco Use  . Smoking status: Former Smoker    Packs/day: 2.00    Years: 25.00    Pack years: 50.00    Types: Cigarettes    Last attempt to quit: 06/11/1991    Years since quitting: 26.5  . Smokeless tobacco: Never Used  Substance and Sexual Activity  . Alcohol use: No  . Drug use: No  . Sexual activity: Not on file  Lifestyle  . Physical activity:    Days per week: Not on file    Minutes per session: Not on file  . Stress: Not on file  Relationships  . Social connections:    Talks on phone: Not on file    Gets together: Not  on file    Attends religious service: Not on file    Active member of club or organization: Not on file    Attends meetings of clubs or organizations: Not on file    Relationship status: Not on file  . Intimate partner violence:    Fear of current or ex partner: Not on file    Emotionally abused: Not on file    Physically abused: Not on file    Forced sexual activity: Not on file  Other Topics Concern  . Not on file  Social History Narrative  . Not on file    Family History  Problem Relation Age of Onset  . Heart attack Father   . Heart attack Brother     Review of Systems:  As stated in the HPI and otherwise negative.   BP 118/60   Pulse 61   Ht 5\' 10"  (1.778 m)   Wt 183 lb (83 kg)   SpO2 95%   BMI 26.26 kg/m   Physical Examination:  General: Well developed, well nourished, NAD  HEENT: OP  clear, mucus membranes moist  SKIN: warm, dry. No rashes. Neuro: No focal deficits  Musculoskeletal: Muscle strength 5/5 all ext  Psychiatric: Mood and affect normal  Neck: No JVD, no carotid bruits, no thyromegaly, no lymphadenopathy.  Lungs:Clear bilaterally, no wheezes, rhonci, crackles Cardiovascular: Regular rate and rhythm. No murmurs, gallops or rubs. Abdomen:Soft. Bowel sounds present. Non-tender.  Extremities: No lower extremity edema. Pulses are 2 + in the bilateral DP/PT.  EKG:  EKG is ordered today. The ekg ordered today demonstrates NSR, rate 61 bpm.   Recent Labs: No results found for requested labs within last 8760 hours.   Lipid Panel    Component Value Date/Time   CHOL 105 07/08/2016 0731   TRIG 114 07/08/2016 0731   HDL 37 (L) 07/08/2016 0731   CHOLHDL 2.8 07/08/2016 0731   CHOLHDL 3.1 06/13/2015 0914   VLDL 24 06/13/2015 0914   LDLCALC 45 07/08/2016 0731     Wt Readings from Last 3 Encounters:  12/12/17 183 lb (83 kg)  07/01/16 186 lb (84.4 kg)  06/13/15 186 lb 12.8 oz (84.7 kg)     Other studies Reviewed: Additional studies/ records that were reviewed today include: . Review of the above records demonstrates:    Assessment and Plan:   1. Coronary artery disease without angina: No chest pain. Stress myoview January 2016 without ischemia.  Will continue ASA, statin, beta blocker and Imdur.   2. HYPERLIPIDEMIA: LDL at goal in January 2018. He had recent labs [facility]. I will ask him to have this faxed over. He is given our fax number today Continue statin.    3. Pulmonary nodules/renal mass: Renal mass and pulmonary nodules by CT chest/abd in 2014 and repeat in January 2016 showed stability. Recommendations made for follow up Sept 2016 but this testing was not completed due to cost. He needs to have the CT chest and abdomen repeated now. He wishes to have this done at the [facility]. I am giving him his CT report from January 2016 today to take to the [facility].    Current medicines are reviewed at length with the patient today.  The patient does not have concerns regarding medicines.  The following changes have been made:  no change  Labs/ tests ordered today include:   No orders of the defined types were placed in this encounter.    Disposition:   FU with me in  12  months   Signed, Verne Carrow, MD 12/12/2017 3:36 PM    [facility] [address] [place], [place], [place]  [address] Phone: [phone]; Fax: [phone]

## 2017-12-12 ENCOUNTER — Ambulatory Visit (INDEPENDENT_AMBULATORY_CARE_PROVIDER_SITE_OTHER): Payer: BLUE CROSS/BLUE SHIELD | Admitting: Cardiovascular Disease

## 2017-12-12 ENCOUNTER — Encounter (INDEPENDENT_AMBULATORY_CARE_PROVIDER_SITE_OTHER): Payer: Self-pay

## 2017-12-12 ENCOUNTER — Encounter: Payer: Self-pay | Admitting: Cardiovascular Disease

## 2017-12-12 VITALS — BP 118/60 | HR 61 | Ht 70.0 in | Wt 183.0 lb

## 2017-12-12 DIAGNOSIS — I2510 Atherosclerotic heart disease of native coronary artery without angina pectoris: Secondary | ICD-10-CM | POA: Diagnosis not present

## 2017-12-12 DIAGNOSIS — E7800 Pure hypercholesterolemia, unspecified: Secondary | ICD-10-CM

## 2017-12-12 MED ORDER — ISOSORBIDE MONONITRATE ER 30 MG PO TB24: ORAL_TABLET | ORAL | 3 refills | Status: DC

## 2017-12-12 MED ORDER — DILTIAZEM HCL ER COATED BEADS 120 MG PO CP24: ORAL_CAPSULE | ORAL | 3 refills | Status: DC

## 2017-12-12 MED ORDER — SIMVASTATIN 40 MG PO TABS: ORAL_TABLET | ORAL | 3 refills | Status: DC

## 2017-12-12 MED ORDER — NITROGLYCERIN 0.4 MG SL SUBL: 0.4000 mg | SUBLINGUAL_TABLET | SUBLINGUAL | 3 refills | Status: DC | PRN

## 2017-12-12 MED ORDER — METOPROLOL TARTRATE 50 MG PO TABS: ORAL_TABLET | ORAL | 3 refills | Status: DC

## 2017-12-12 NOTE — Patient Instructions (Signed)

## 2017-12-15 NOTE — Addendum Note (Signed)
Addended by: Vernard GamblesOLE, Traniece Boffa S on: 12/15/2017 11:31 AM   Modules accepted: Orders

## 2018-01-08 ENCOUNTER — Other Ambulatory Visit: Payer: Self-pay | Admitting: Cardiovascular Disease

## 2018-01-08 NOTE — Telephone Encounter (Signed)
Order Providers   Prescribing Provider Encounter Provider  Kathleene HazelMcAlhany, Christopher D, MD Kathleene HazelMcAlhany, Christopher D, MD  Outpatient Medication Detail    Disp Refills Start End   isosorbide mononitrate (IMDUR) 30 MG 24 hr tablet 90 tablet 3 12/12/2017    Sig: Take one tablet by mouth daily.   Sent to pharmacy as: isosorbide mononitrate (IMDUR) 30 MG 24 hr tablet   E-Prescribing Status: Receipt confirmed by pharmacy (12/12/2017 3:36 PM EDT)   Pharmacy   [id number] [place]- [place], [place] [address]

## 2018-03-19 DIAGNOSIS — J069 Acute upper respiratory infection, unspecified: Secondary | ICD-10-CM | POA: Diagnosis not present

## 2018-04-10 ENCOUNTER — Encounter

## 2018-05-06 ENCOUNTER — Emergency Department (HOSPITAL_COMMUNITY): Payer: No Typology Code available for payment source

## 2018-05-06 ENCOUNTER — Encounter (HOSPITAL_COMMUNITY): Payer: Self-pay | Admitting: Emergency Medicine

## 2018-05-06 ENCOUNTER — Other Ambulatory Visit: Payer: Self-pay

## 2018-05-06 ENCOUNTER — Observation Stay (HOSPITAL_COMMUNITY)
Admission: EM | Admit: 2018-05-06 | Discharge: 2018-05-07 | Disposition: A | Discharge status: Home / Self Care | Payer: No Typology Code available for payment source | Attending: Neurological Surgery | Admitting: Neurological Surgery

## 2018-05-06 DIAGNOSIS — Z87891 Personal history of nicotine dependence: Secondary | ICD-10-CM | POA: Diagnosis not present

## 2018-05-06 DIAGNOSIS — S065XAA Traumatic subdural hemorrhage with loss of consciousness status unknown, initial encounter: Secondary | ICD-10-CM

## 2018-05-06 DIAGNOSIS — W208XXA Other cause of strike by thrown, projected or falling object, initial encounter: Secondary | ICD-10-CM | POA: Diagnosis not present

## 2018-05-06 DIAGNOSIS — I6201 Nontraumatic acute subdural hemorrhage: Principal | ICD-10-CM | POA: Insufficient documentation

## 2018-05-06 DIAGNOSIS — S065X9A Traumatic subdural hemorrhage with loss of consciousness of unspecified duration, initial encounter: Secondary | ICD-10-CM

## 2018-05-06 DIAGNOSIS — R51 Headache: Secondary | ICD-10-CM | POA: Diagnosis present

## 2018-05-06 DIAGNOSIS — T07XXXA Unspecified multiple injuries, initial encounter: Secondary | ICD-10-CM

## 2018-05-06 HISTORY — DX: Personal history of urinary calculi: Z87.442

## 2018-05-06 HISTORY — DX: Traumatic subdural hemorrhage with loss of consciousness of unspecified duration, initial encounter: S06.5X9A

## 2018-05-06 HISTORY — DX: Traumatic subdural hemorrhage with loss of consciousness status unknown, initial encounter: S06.5XAA

## 2018-05-06 HISTORY — DX: Essential (primary) hypertension: I10

## 2018-05-06 HISTORY — DX: Acute myocardial infarction, unspecified: I21.9

## 2018-05-06 LAB — MRSA PCR SCREENING: MRSA by PCR: NEGATIVE

## 2018-05-06 MED ORDER — ACETAMINOPHEN 650 MG RE SUPP: 650.0000 mg | RECTAL | Status: DC | PRN

## 2018-05-06 MED ORDER — HYDROCODONE-ACETAMINOPHEN 5-325 MG PO TABS: 1.0000 | ORAL_TABLET | ORAL | Status: DC | PRN

## 2018-05-06 MED ORDER — ACETAMINOPHEN 325 MG PO TABS
650.0000 mg | ORAL_TABLET | ORAL | Status: DC | PRN
  Administered 2018-05-06: 650 mg via ORAL
  Filled 2018-05-06: qty 2

## 2018-05-06 MED ORDER — METOPROLOL TARTRATE 50 MG PO TABS
50.0000 mg | ORAL_TABLET | Freq: Two times a day (BID) | ORAL | Status: DC
  Administered 2018-05-06 – 2018-05-07 (×2): 50 mg via ORAL
  Filled 2018-05-06 (×2): qty 1

## 2018-05-06 MED ORDER — ISOSORBIDE MONONITRATE ER 30 MG PO TB24
30.0000 mg | ORAL_TABLET | Freq: Every day | ORAL | Status: DC
  Administered 2018-05-07: 30 mg via ORAL
  Filled 2018-05-06: qty 1

## 2018-05-06 MED ORDER — SIMVASTATIN 20 MG PO TABS
40.0000 mg | ORAL_TABLET | Freq: Every day | ORAL | Status: DC
  Administered 2018-05-06: 40 mg via ORAL
  Filled 2018-05-06: qty 2

## 2018-05-06 MED ORDER — ONDANSETRON HCL 4 MG/2ML IJ SOLN: 4.0000 mg | INTRAMUSCULAR | Status: DC | PRN

## 2018-05-06 MED ORDER — NITROGLYCERIN 0.4 MG SL SUBL: 0.4000 mg | SUBLINGUAL_TABLET | SUBLINGUAL | Status: DC | PRN

## 2018-05-06 MED ORDER — ATORVASTATIN CALCIUM 10 MG PO TABS: 20.0000 mg | ORAL_TABLET | Freq: Every day | ORAL | Status: DC

## 2018-05-06 MED ORDER — PROMETHAZINE HCL 25 MG PO TABS: 12.5000 mg | ORAL_TABLET | ORAL | Status: DC | PRN

## 2018-05-06 MED ORDER — ONDANSETRON HCL 4 MG PO TABS: 4.0000 mg | ORAL_TABLET | ORAL | Status: DC | PRN

## 2018-05-06 MED ORDER — DILTIAZEM HCL ER COATED BEADS 120 MG PO CP24
120.0000 mg | ORAL_CAPSULE | Freq: Every day | ORAL | Status: DC
  Administered 2018-05-06 – 2018-05-07 (×2): 120 mg via ORAL
  Filled 2018-05-06 (×2): qty 1

## 2018-05-06 MED ORDER — OXYCODONE-ACETAMINOPHEN 5-325 MG PO TABS: 1.0000 | ORAL_TABLET | ORAL | Status: DC | PRN

## 2018-05-06 NOTE — Discharge Instructions (Addendum)
Discharge Instructions  No restriction in activities, slowly increase your activity back to normal.   Due to the bleeding around your brain (subdural hematoma), do not take your baby aspirin for 1 week, you can restart it on 05/13/18.  Follow up with Dr. Maurice Smallstergard in 2 weeks after discharge. If you do not already have a discharge appointment, please call his office at [phone] to schedule a follow up appointment. If you have any concerns or questions, please call the office and let us know.

## 2018-05-06 NOTE — ED Provider Notes (Signed)
[facility] EMERGENCY DEPARTMENT Provider Note   CSN: [id number] Arrival date & time: 05/06/18  0848     History   Chief Complaint Chief Complaint  Patient presents with  . Fall    HPI Jeffrey Lyons is a 72 y.o. male.  HPI   He presents for evaluation of injuries when some doors fell onto him pushing him to the floor of the trailer that he was in.  He had to call for help to get the material off of him.  He did not get up off the floor.  He presents complaining of pain in his tailbone, by EMS.  Cervical collar was applied in the field.  The patient reports he hit the right side of his head and hurt his neck when he was knocked down.  He denies loss of consciousness, blurred vision, nausea, vomiting, weakness or dizziness.  There are no other known modifying factors.  Past Medical History:  Diagnosis Date  . Coronary artery disease    MI in 1993, cath 2003 with occluded RCA  . Heart attack (HCC)   . Hyperlipidemia   . Hypertension   . Subdural hematoma (HCC) 05/06/2018   large door falling on him and striking him in the head; work related injury    Patient Active Problem List   Diagnosis Date Noted  . Subdural hematoma (HCC) 05/06/2018  . Nausea and vomiting 02/10/2013  . Leukocytosis 02/10/2013  . Elevated lactic acid level 02/10/2013  . Hypotension 02/10/2013  . Dehydration 02/10/2013  . CHEST PAIN UNSPECIFIED 05/21/2010  . HYPERLIPIDEMIA-MIXED 05/11/2009  . CAD, NATIVE VESSEL 05/11/2009    Past Surgical History:  Procedure Laterality Date  . APPENDECTOMY    . CHOLECYSTECTOMY          Home Medications    Prior to Admission medications   Medication Sig Start Date End Date Taking? Authorizing Provider  aspirin 81 MG tablet Take 81 mg by mouth daily.     Yes [provider]  diltiazem (CARDIZEM CD) 120 MG 24 hr capsule TAKE 1 CAPSULE BY MOUTH DAILY. Patient taking differently: Take 120 mg by mouth daily.  12/12/17  Yes Kathleene Hazel, MD  isosorbide mononitrate (IMDUR) 30 MG 24 hr tablet Take one tablet by mouth daily. Patient taking differently: Take 30 mg by mouth daily.  12/12/17  Yes Kathleene Hazel, MD  metoprolol tartrate (LOPRESSOR) 50 MG tablet TAKE 1 TABLET BY MOUTH 2 (TWO) TIMES DAILY. Patient taking differently: Take 50 mg by mouth 2 (two) times daily.  12/12/17  Yes Kathleene Hazel, MD  nitroGLYCERIN (NITROSTAT) 0.4 MG SL tablet Place 1 tablet (0.4 mg total) under the tongue every 5 (five) minutes as needed for chest pain. 12/12/17  Yes Kathleene Hazel, MD  simvastatin (ZOCOR) 40 MG tablet TAKE 1 TABLET BY MOUTH EVERY EVENING. Patient taking differently: Take 40 mg by mouth daily at 6 PM.  12/12/17  Yes Kathleene Hazel, MD    Family History Family History  Problem Relation Age of Onset  . Heart attack Father   . Heart attack Brother     Social History Social History   Tobacco Use  . Smoking status: Former Smoker    Packs/day: 2.00    Years: 25.00    Pack years: 50.00    Types: Cigarettes    Last attempt to quit: 06/11/1991    Years since quitting: 26.9  . Smokeless tobacco: Never Used  Substance Use Topics  .  Alcohol use: No  . Drug use: No     Allergies   Patient has no known allergies.   Review of Systems Review of Systems  All other systems reviewed and are negative.    Physical Exam Updated Vital Signs BP 124/78 (BP Location: Right Arm)   Pulse 87   Temp 97.9 F (36.6 C) (Oral)   Resp (!) 23   Ht 5\' 10"  (1.778 m)   Wt 82.6 kg   SpO2 97%   BMI 26.11 kg/m   Physical Exam  Constitutional: He is oriented to person, place, and time. He appears well-developed and well-nourished. He appears distressed (Somewhat uncomfortable).  HENT:  Head: Normocephalic.  Right Ear: External ear normal.  Left Ear: External ear normal.  Tender right parietal occipital scalp; without crepitation or deformity.  Eyes: Pupils are equal, round, and reactive  to light. Conjunctivae and EOM are normal.  Neck: Normal range of motion and phonation normal. Neck supple.  Cardiovascular: Normal rate, regular rhythm and normal heart sounds.  Pulmonary/Chest: Effort normal and breath sounds normal. No respiratory distress. He exhibits no bony tenderness.  Abdominal: Soft. There is no tenderness.  Musculoskeletal:  Tender right posterior neck.  No cervical step-off.  Pain with passive and active motion of the left hip, which localizes to the mid sacral region.  No deformities of the arms or legs bilaterally.  Neurological: He is alert and oriented to person, place, and time. No cranial nerve deficit or sensory deficit. He exhibits normal muscle tone. Coordination normal.  Skin: Skin is warm, dry and intact.  Psychiatric: He has a normal mood and affect. His behavior is normal. Judgment and thought content normal.  Nursing note and vitals reviewed.    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 EXAM: CHEST - 2 VIEW COMPARISON:  CT of the chest 06/20/2014. FINDINGS: Heart size is normal. Lungs are clear. There is no edema or effusion. Surgical clips are present at the gallbladder fossa. Moderate degenerative changes are noted at the [facility] joints bilaterally. IMPRESSION: No active cardiopulmonary disease. Electronically Signed   By: Marin Roberts M.D.   On: 05/06/2018 10:17   Dg Lumbar Spine Complete  Result Date: 05/06/2018 CLINICAL DATA:  Low back pain after work injury. EXAM: LUMBAR SPINE - COMPLETE 4+ VIEW COMPARISON:  CT scan of June 20, 2014. FINDINGS: No fracture or spondylolisthesis is noted. Minimal anterior osteophyte formation is noted at L2-3, L3-4 and L4-5, but no significant disc space narrowing is noted.  Atherosclerosis of thoracic aorta is noted. Posterior facet joints are unremarkable. IMPRESSION: Minimal degenerative changes as described above. No acute abnormality seen in the lumbar spine. Aortic Atherosclerosis (ICD10-I70.0). Electronically Signed   By: Lupita Raider, M.D.   On: 05/06/2018 10:20   Dg Pelvis 1-2 Views  Result Date: 05/06/2018 CLINICAL DATA:  Left hip pain after injury at work. EXAM: PELVIS - 1-2 VIEW COMPARISON:  None. FINDINGS: There is no evidence of pelvic fracture or diastasis. No pelvic bone lesions are seen. IMPRESSION: Negative. Electronically Signed   By: Lupita Raider, M.D.   On: 05/06/2018 10:18   Ct Head Wo Contrast  Result Date: 05/06/2018 CLINICAL DATA:  Heavy object fell on head. EXAM: CT HEAD WITHOUT CONTRAST CT  CERVICAL SPINE WITHOUT CONTRAST TECHNIQUE: Multidetector CT imaging of the head and cervical spine was performed following the standard protocol without intravenous contrast. Multiplanar CT image reconstructions of the cervical spine were also generated. COMPARISON:  None. FINDINGS: CT HEAD FINDINGS Brain: There is blood noted along the mid to posterior falx in the midline compatible with small subdural hematoma measuring 5 mm in thickness. No mass effect or midline shift. Mild cerebral atrophy. No intraparenchymal hemorrhage. No hydrocephalus. Vascular: No hyperdense vessel or unexpected calcification. Skull: No acute calvarial abnormality. Sinuses/Orbits: No acute findings. Other: None CT CERVICAL SPINE FINDINGS Alignment: No subluxation Skull base and vertebrae: Slight flattening of the superior endplate of C7, best seen on sagittal imaging. This may reflect a slight compression fracture. Soft tissues and spinal canal: No prevertebral fluid or swelling. No visible canal hematoma. Disc levels:  No acute findings Upper chest: No acute findings Other: None IMPRESSION: Small midline hemorrhage along the mid to posterior falx measuring approximately 5 mm in  thickness, likely subdural. No intraparenchymal hemorrhage. No midline shift or hydrocephalus. Slight flattening of the superior endplate of C7 may reflect a slight compression fracture deformity. These results were called by telephone at the time of interpretation on 05/06/2018 at 10:08 am to Dr. Mancel Bale , who verbally acknowledged these results. Electronically Signed   By: Charlett Nose M.D.   On: 05/06/2018 10:10   Ct Cervical Spine Wo Contrast  Result Date: 05/06/2018 CLINICAL DATA:  Heavy object fell on head. EXAM: CT HEAD WITHOUT CONTRAST CT CERVICAL SPINE WITHOUT CONTRAST TECHNIQUE: Multidetector CT imaging of the head and cervical spine was performed following the standard protocol without intravenous contrast. Multiplanar CT image reconstructions of the cervical spine were also generated. COMPARISON:  None. FINDINGS: CT HEAD FINDINGS Brain: There is blood noted along the mid to posterior falx in the midline compatible with small subdural hematoma measuring 5 mm in thickness. No mass effect or midline shift. Mild cerebral atrophy. No intraparenchymal hemorrhage. No hydrocephalus. Vascular: No hyperdense vessel or unexpected calcification. Skull: No acute calvarial abnormality. Sinuses/Orbits: No acute findings. Other: None CT CERVICAL SPINE FINDINGS Alignment: No subluxation Skull base and vertebrae: Slight flattening of the superior endplate of C7, best seen on sagittal imaging. This may reflect a slight compression fracture. Soft tissues and spinal canal: No prevertebral fluid or swelling. No visible canal hematoma. Disc levels:  No acute findings Upper chest: No acute findings Other: None IMPRESSION: Small midline hemorrhage along the mid to posterior falx measuring approximately 5 mm in thickness, likely subdural. No intraparenchymal hemorrhage. No midline shift or hydrocephalus. Slight flattening of the superior endplate of C7 may reflect a slight compression fracture deformity. These results  were called by telephone at the time of interpretation on 05/06/2018 at 10:08 am to Dr. Mancel Bale , who verbally acknowledged these results. Electronically Signed   By: Charlett Nose M.D.   On: 05/06/2018 10:10    Procedures .Critical Care Performed by: Mancel Bale, MD Authorized by: Mancel Bale, MD   Critical care provider statement:    Critical care time (minutes):  35   Critical care start time:  05/06/2018 9:00 AM   Critical care end time:  05/06/2018 12:19 PM   Critical care time was exclusive of:  Separately billable procedures and treating other patients   Critical care was necessary to treat or prevent imminent or life-threatening deterioration of the following conditions:  CNS failure or compromise   Critical care was time spent personally by me on  the following activities:  Blood draw for specimens, development of treatment plan with patient or surrogate, discussions with consultants, evaluation of patient's response to treatment, examination of patient, obtaining history from patient or surrogate, ordering and performing treatments and interventions, ordering and review of laboratory studies, pulse oximetry, re-evaluation of patient's condition, review of old charts and ordering and review of radiographic studies   (including critical care time)  Medications Ordered in ED Medications  isosorbide mononitrate (IMDUR) 24 hr tablet 30 mg (has no administration in time range)  diltiazem (CARDIZEM CD) 24 hr capsule 120 mg (120 mg Oral Given 05/06/18 1828)  metoprolol tartrate (LOPRESSOR) tablet 50 mg (has no administration in time range)  nitroGLYCERIN (NITROSTAT) SL tablet 0.4 mg (has no administration in time range)  simvastatin (ZOCOR) tablet 40 mg (40 mg Oral Given 05/06/18 1828)  ondansetron (ZOFRAN) tablet 4 mg (has no administration in time range)    Or  ondansetron (ZOFRAN) injection 4 mg (has no administration in time range)  promethazine (PHENERGAN) tablet 12.5-25  mg (has no administration in time range)  acetaminophen (TYLENOL) tablet 650 mg (650 mg Oral Given 05/06/18 2025)    Or  acetaminophen (TYLENOL) suppository 650 mg ( Rectal See Alternative 05/06/18 2025)  HYDROcodone-acetaminophen (NORCO/VICODIN) 5-325 MG per tablet 1 tablet (has no administration in time range)  oxyCODONE-acetaminophen (PERCOCET/ROXICET) 5-325 MG per tablet 1 tablet (has no administration in time range)     Initial Impression / Assessment and Plan / ED Course  I have reviewed the triage vital signs and the nursing notes.  Pertinent labs & imaging results that were available during my care of the patient were reviewed by me and considered in my medical decision making (see chart for details).  Clinical Course as of May 06 2025  Wed May 06, 2018  1038 Review of plain images, by me ,indicates lumbar spine degenerative changes without fracture.  Pelvis without fracture.  Chest x-ray no acute disease or fracture.   [EW]  1039 Review of CT images by me; small subdural falx.  C7 endplate fracture.   [EW]  1040 Cervical collar removed he has some stiffness of his neck with mild right lower pain on movement.  No change in clinical status at this time.   [EW]    Clinical Course User Index [EW] Mancel BaleWentz, Walden Statz, MD     Patient Vitals for the past 24 hrs:  BP Temp Temp src Pulse Resp SpO2 Height Weight  05/06/18 1520 124/78 97.9 F (36.6 C) Oral 87 (!) 23 97 % - -  05/06/18 1321 128/64 - - 64 18 96 % - -  05/06/18 0855 (!) 152/86 - - (!) 50 14 98 % - -  05/06/18 0853 - 97.8 F (36.6 C) Oral - - - - -  05/06/18 0850 - - - - - - 5\' 10"  (1.778 m) 82.6 kg    At time of disposition- reevaluation with update and discussion. After initial assessment and treatment, an updated evaluation reveals he remains comfortable has no further complaints.Mancel Bale. Cung Masterson   Medical Decision Making: Patient with fall after involvement spell with him at work.  Multiple contusions with  intracranial injury, subdural.  Neurologically intact.  No hemodynamic instability.  No evidence for fracture spine.  Patient will require observation and treatment neurosurgery.  Case discussed with neurosurgery who will admit the patient for monitoring.  CRITICAL CARE- yes Performed by: Mancel BaleElliott Marjorie Deprey  Nursing Notes Reviewed/ Care Coordinated Applicable Imaging Reviewed Interpretation of Laboratory Data incorporated  into ED treatment   Plan: Admit   Final Clinical Impressions(s) / ED Diagnoses   Final diagnoses:  Subdural hematoma [facility] 2)  Multiple contusions    ED Discharge Orders    None       Mancel Bale, MD 05/06/18 2029

## 2018-05-06 NOTE — Progress Notes (Signed)
ED called to give report before patient was approved or viewed so that Charge could properly give the assignment. When they did call they were told Nurse was not ready and asked to speak to Charge who was also busy, told Secretary that when they call back in 10 minutes if we arent ready we will be reported. Charge called back for report and Nurse was not ready to give report.

## 2018-05-06 NOTE — ED Notes (Signed)
Neuro surgery at bedside.

## 2018-05-06 NOTE — H&P (Signed)
Neurosurgery H&P  CC: Subdural hematoma  HPI: This is a 72 y.o. man that presents after a large door fell off a truck and pinned him to the ground, striking him on the head. He was pinned underneath it for about a minute. Since that time, he has had a headache localized to the area where the door hit him - in the right occipitocervical junction roughly over the insertion of the SCM. No new weakness, numbness, or parasthesias. +ASA81, otherwise no recent use of anti-platelet or anti-coagulant medications.   ROS: A 14 point ROS was performed and is negative except as noted in the HPI.   PMHx:  Past Medical History:  Diagnosis Date  . Coronary artery disease    MI in 1993, cath 2003 with occluded RCA  . Heart attack (HCC)   . Hyperlipidemia   . Hypertension    FamHx:  Family History  Problem Relation Age of Onset  . Heart attack Father   . Heart attack Brother    SocHx:  reports that he quit smoking about 26 years ago. His smoking use included cigarettes. He has a 50.00 pack-year smoking history. He has never used smokeless tobacco. He reports that he does not drink alcohol or use drugs.  Exam: Vital signs in last 24 hours: Temp:  [97.8 F (36.6 C)] 97.8 F (36.6 C) (11/27 0853) Pulse Rate:  [50] 50 (11/27 0855) Resp:  [14] 14 (11/27 0855) BP: (152)/(86) 152/86 (11/27 0855) SpO2:  [98 %] 98 % (11/27 0855) Weight:  [82.6 kg] 82.6 kg (11/27 0850) General: Awake, alert, cooperative, lying in bed in NAD Head: normocephalic, tenderness over area just medial to R mastoid HEENT: neck supple, NEXUS exam revealed no abnormality, no neck pain with cervical range of motion Pulmonary: breathing room air comfortably, no evidence of increased work of breathing Cardiac: RRR Abdomen: S NT ND Extremities: warm and well perfused x4 Neuro: AOx3, PERRL, EOMI, FS Strength 5/5 x4, SILTx4, no drift   Assessment and Plan: 72 y.o. man s/p heavy door striking him in the head. CTH personally  reviewed, which shows a left acute parafalcine subdural hematoma. CT C-spine reviewed, endplate abnormality at C7 noted, does not appear to be a clear endplate fracture.  -no acute neurosurgical intervention indicated at this time -patient has no cervical symptoms, including with ROM. Clinically inconsistent with cervical spine fracture. If there is a fracture, it appears to be a stable pattern. No cervical spine collar required at this time. Pt agreed to inform me if he begins having any significant or new axial neck pain or radicular symptoms -observe overnight on stepdown given ASA81 and subdural hematoma  Jadene Pierinihomas A Shandell Jallow, MD 05/06/18 12:36 PM [facility]

## 2018-05-06 NOTE — ED Notes (Signed)
ED TO INPATIENT HANDOFF REPORT  Name/Age/Gender Jeffrey Lyons 72 y.o. male  Code Status Code Status History    Date Active Date Inactive Code Status Order ID Comments User Context   02/10/2013 1459 02/11/2013 1834 Full Code [id number]  Clydia LlanoElmahi, Mutaz, MD Inpatient      Home/SNF/Other Home  Chief Complaint fall  Level of Care/Admitting Diagnosis ED Disposition    ED Disposition Condition Comment   Admit  [facility] [[id number]]  Level of Care: Stepdown [14]  Diagnosis: Subdural hematoma [facility](HCC) [[id number]]  Admitting Physician: Jadene PieriniSTERGARD, THOMAS A [[id number]]  Attending Physician: Jadene PieriniSTERGARD, THOMAS A [phone][[id number]]  Bed request comments: 4NP  PT Class (Do Not Modify): Observation [104]  PT Acc Code (Do Not Modify): Observation [10022]       Medical History Past Medical History:  Diagnosis Date  . Coronary artery disease    MI in 1993, cath 2003 with occluded RCA  . Heart attack (HCC)   . Hyperlipidemia   . Hypertension     Allergies No Known Allergies  IV Location/Drains/Wounds Patient Lines/Drains/Airways Status   Active Line/Drains/Airways    Name:   Placement date:   Placement time:   Site:   Days:   Peripheral IV 05/06/18 Left Antecubital   05/06/18    1431    Antecubital   less than 1          Labs/Imaging No results found for this or any previous visit (from the past 48 hour(s)). 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 EXAM: CHEST - 2 VIEW COMPARISON:  CT of the chest 06/20/2014. FINDINGS: Heart size is normal. Lungs are clear. There is no edema or effusion. Surgical clips are present at the gallbladder fossa. Moderate degenerative changes are noted [facility] bilaterally. IMPRESSION: No active cardiopulmonary disease. Electronically Signed   By: Marin Robertshristopher  Mattern M.D.   On: 05/06/2018 10:17   Dg Lumbar Spine Complete  Result Date: 05/06/2018 CLINICAL DATA:  Low  back pain after work injury. EXAM: LUMBAR SPINE - COMPLETE 4+ VIEW COMPARISON:  CT scan of June 20, 2014. FINDINGS: No fracture or spondylolisthesis is noted. Minimal anterior osteophyte formation is noted at L2-3, L3-4 and L4-5, but no significant disc space narrowing is noted. Atherosclerosis of thoracic aorta is noted. Posterior facet joints are unremarkable. IMPRESSION: Minimal degenerative changes as described above. No acute abnormality seen in the lumbar spine. Aortic Atherosclerosis (ICD10-I70.0). Electronically Signed   By: Lupita RaiderJames  Green Jr, M.D.   On: 05/06/2018 10:20   Dg Pelvis 1-2 Views  Result Date: 05/06/2018 CLINICAL DATA:  Left hip pain after injury at work. EXAM: PELVIS - 1-2 VIEW COMPARISON:  None. FINDINGS: There is no evidence of pelvic fracture or diastasis. No pelvic bone lesions are seen. IMPRESSION: Negative. Electronically Signed   By: Lupita RaiderJames  Green Jr, M.D.   On: 05/06/2018 10:18   Ct Head Wo Contrast  Result Date: 05/06/2018 CLINICAL DATA:  Heavy object fell on head. EXAM: CT HEAD WITHOUT CONTRAST CT CERVICAL SPINE WITHOUT CONTRAST TECHNIQUE: Multidetector CT imaging of the head and cervical spine was performed following the standard protocol without intravenous contrast. Multiplanar CT image reconstructions of the cervical spine were also generated. COMPARISON:  None. FINDINGS: CT HEAD FINDINGS Brain: There is blood noted along the mid to posterior falx in the midline compatible with small subdural hematoma measuring 5 mm in thickness. No mass effect or midline shift.  Mild cerebral atrophy. No intraparenchymal hemorrhage. No hydrocephalus. Vascular: No hyperdense vessel or unexpected calcification. Skull: No acute calvarial abnormality. Sinuses/Orbits: No acute findings. Other: None CT CERVICAL SPINE FINDINGS Alignment: No subluxation Skull base and vertebrae: Slight flattening of the superior endplate of C7, best seen on sagittal imaging. This may reflect a slight compression  fracture. Soft tissues and spinal canal: No prevertebral fluid or swelling. No visible canal hematoma. Disc levels:  No acute findings Upper chest: No acute findings Other: None IMPRESSION: Small midline hemorrhage along the mid to posterior falx measuring approximately 5 mm in thickness, likely subdural. No intraparenchymal hemorrhage. No midline shift or hydrocephalus. Slight flattening of the superior endplate of C7 may reflect a slight compression fracture deformity. These results were called by telephone at the time of interpretation on 05/06/2018 at 10:08 am to Dr. Mancel Bale , who verbally acknowledged these results. Electronically Signed   By: Charlett Nose M.D.   On: 05/06/2018 10:10   Ct Cervical Spine Wo Contrast  Result Date: 05/06/2018 CLINICAL DATA:  Heavy object fell on head. EXAM: CT HEAD WITHOUT CONTRAST CT CERVICAL SPINE WITHOUT CONTRAST TECHNIQUE: Multidetector CT imaging of the head and cervical spine was performed following the standard protocol without intravenous contrast. Multiplanar CT image reconstructions of the cervical spine were also generated. COMPARISON:  None. FINDINGS: CT HEAD FINDINGS Brain: There is blood noted along the mid to posterior falx in the midline compatible with small subdural hematoma measuring 5 mm in thickness. No mass effect or midline shift. Mild cerebral atrophy. No intraparenchymal hemorrhage. No hydrocephalus. Vascular: No hyperdense vessel or unexpected calcification. Skull: No acute calvarial abnormality. Sinuses/Orbits: No acute findings. Other: None CT CERVICAL SPINE FINDINGS Alignment: No subluxation Skull base and vertebrae: Slight flattening of the superior endplate of C7, best seen on sagittal imaging. This may reflect a slight compression fracture. Soft tissues and spinal canal: No prevertebral fluid or swelling. No visible canal hematoma. Disc levels:  No acute findings Upper chest: No acute findings Other: None IMPRESSION: Small midline  hemorrhage along the mid to posterior falx measuring approximately 5 mm in thickness, likely subdural. No intraparenchymal hemorrhage. No midline shift or hydrocephalus. Slight flattening of the superior endplate of C7 may reflect a slight compression fracture deformity. These results were called by telephone at the time of interpretation on 05/06/2018 at 10:08 am to Dr. Mancel Bale , who verbally acknowledged these results. Electronically Signed   By: Charlett Nose M.D.   On: 05/06/2018 10:10    Pending Labs Unresulted Labs (From admission, onward)   None      Vitals/Pain Today's Vitals   05/06/18 0853 05/06/18 0855 05/06/18 1047 05/06/18 1321  BP:  (!) 152/86  128/64  Pulse:  (!) 50  64  Resp:  14  18  Temp: 97.8 F (36.6 C)     TempSrc: Oral     SpO2:  98%  96%  Weight:      Height:      PainSc:   0-No pain     Isolation Precautions No active isolations  Medications Medications - No data to display  Mobility walks

## 2018-05-06 NOTE — Progress Notes (Signed)
2nd attempt to call for report.

## 2018-05-06 NOTE — ED Notes (Signed)
Attempted report x1. RN unavailable. 

## 2018-05-06 NOTE — ED Triage Notes (Signed)
Pt was at work  At [employer]. Pt was unloading doors from a truck. Doors fell on his hips/legs; pinned him for approx a min. Pt hit right side of his head. No LOC. Pt complaining of L hip pain. No blood thinners. Pt has a small right elbow skin tear. Pt alert and oriented.

## 2018-05-06 NOTE — Discharge Summary (Addendum)
Discharge Summary  Date of Admission: 05/06/2018  Date of Discharge: 05/07/2018  Attending Physician: Autumn Pattyhomas [place] Festa, MD  Hospital Course: Patient was admitted after a head trauma from a large door falling on him and striking him in the head. A CT head showed a left parafalcine acute subdural hematoma. A CT C-spine showed a questionable endplate abnormality of C7 that was clinically inconsistent with a cervical spine fracture. He was admitted for observation due to the subdural hematoma. His hospital course was uncomplicated and the patient was discharged home on 05/07/2018. He will follow up in clinic with me in 2 weeks.  Neurologic exam at discharge:  AOx3, PERRL, EOMI, FS, TM Strength 5/5 x4, SILTx4, no drift

## 2018-05-07 MED ORDER — CYCLOBENZAPRINE HCL 10 MG PO TABS: 10.0000 mg | ORAL_TABLET | Freq: Three times a day (TID) | ORAL | 0 refills | Status: AC | PRN

## 2018-05-07 NOTE — Plan of Care (Signed)
  Problem: Education: Goal: Knowledge of General Education information will improve Description: Including pain rating scale, medication(s)/side effects and non-pharmacologic comfort measures Outcome: Progressing   Problem: Activity: Goal: Risk for activity intolerance will decrease Outcome: Progressing   

## 2018-05-07 NOTE — Progress Notes (Signed)
Neurosurgery Service Progress Note  Subjective: No acute events overnight, doing well, some muscle soreness in SCM on the right otherwise no complaints   Objective: Vitals:   05/06/18 1520 05/06/18 2026 05/07/18 0000 05/07/18 0348  BP: 124/78 131/80 122/73 108/60  Pulse: 87   (!) 57  Resp: (!) 23   18  Temp: 97.9 F (36.6 C) 98.5 F (36.9 C) 98.4 F (36.9 C) 97.7 F (36.5 C)  TempSrc: Oral Oral Oral Oral  SpO2: 97%   96%  Weight:      Height:       Temp (24hrs), Avg:98.1 F (36.7 C), Min:97.7 F (36.5 C), Max:98.5 F (36.9 C)  CBC Latest Ref Rng & Units 02/11/2013 02/10/2013 05/30/2010  WBC 4.0 - 10.5 K/uL 9.2 14.0(H) 6.2  Hemoglobin 13.0 - 17.0 g/dL 12.0(L) 15.2 15.0  Hematocrit 39.0 - 52.0 % 34.7(L) 41.2 43.3  Platelets 150 - 400 K/uL 139(L) 173 178.0   BMP Latest Ref Rng & Units 07/08/2016 05/26/2014 02/11/2013  Glucose 65 - 99 mg/dL 960(A105(H) 97 540(J102(H)  BUN 8 - 27 mg/dL 12 13 20   Creatinine 0.76 - 1.27 mg/dL 8.111.13 1.2 9.141.12  BUN/Creat Ratio 10 - 24 11 - -  Sodium 134 - 144 mmol/L 139 139 142  Potassium 3.5 - 5.2 mmol/L 3.9 4.1 4.3  Chloride 96 - 106 mmol/L 102 105 111  CO2 18 - 29 mmol/L 26 26 25   Calcium 8.6 - 10.2 mg/dL 9.0 9.3 8.0(L)   No intake or output data in the 24 hours ending 05/07/18 0836  Current Facility-Administered Medications:  .  acetaminophen (TYLENOL) tablet 650 mg, 650 mg, Oral, Q4H PRN, 650 mg at 05/06/18 2025 **OR** acetaminophen (TYLENOL) suppository 650 mg, 650 mg, Rectal, Q4H PRN, Jadene Pierinistergard, Kentarius Partington A, MD .  atorvastatin (LIPITOR) tablet 20 mg, 20 mg, Oral, q1800, Jadene Pierinistergard, Amie Cowens A, MD .  diltiazem (CARDIZEM CD) 24 hr capsule 120 mg, 120 mg, Oral, Daily, Brandan Robicheaux A, MD, 120 mg at 05/06/18 1828 .  HYDROcodone-acetaminophen (NORCO/VICODIN) 5-325 MG per tablet 1 tablet, 1 tablet, Oral, Q4H PRN, Jadene Pierinistergard, Jadavion Spoelstra A, MD .  isosorbide mononitrate (IMDUR) 24 hr tablet 30 mg, 30 mg, Oral, Daily, Annalissa Murphey A, MD .  metoprolol tartrate  (LOPRESSOR) tablet 50 mg, 50 mg, Oral, BID, Jadene Pierinistergard, Channie Bostick A, MD, 50 mg at 05/06/18 2143 .  nitroGLYCERIN (NITROSTAT) SL tablet 0.4 mg, 0.4 mg, Sublingual, Q5 min PRN, Leonce Bale A, MD .  ondansetron (ZOFRAN) tablet 4 mg, 4 mg, Oral, Q4H PRN **OR** ondansetron (ZOFRAN) injection 4 mg, 4 mg, Intravenous, Q4H PRN, Eliyas Suddreth, Clovis Puhomas A, MD .  oxyCODONE-acetaminophen (PERCOCET/ROXICET) 5-325 MG per tablet 1 tablet, 1 tablet, Oral, Q4H PRN, Jadene Pierinistergard, Zlatan Hornback A, MD .  promethazine (PHENERGAN) tablet 12.5-25 mg, 12.5-25 mg, Oral, Q4H PRN, Jadene Pierinistergard, [place] Schreiner A, MD   Physical Exam: AOx3, PERRL, EOMI, FS, Strength 5/5 x4, SILTx4, no drift  Assessment & Plan: 72 y.o. man s/p TBI with parafalcine SDH, recovering well. -discharge home today  Jadene Pierinihomas A Iyan Flett  05/07/18 8:36 AM

## 2018-05-07 NOTE — Progress Notes (Signed)
Pt being discharged. Pt given AVS and prescriptions. Pt demonstrates and verbalizes understanding. All questions and concerns addressed.  Vitals stable. Pt taken home by daughter. Pt escorted out via staff.

## 2018-12-27 ENCOUNTER — Other Ambulatory Visit: Payer: Self-pay | Admitting: Cardiovascular Disease

## 2018-12-28 ENCOUNTER — Other Ambulatory Visit: Payer: Self-pay | Admitting: Cardiovascular Disease

## 2019-01-05 DIAGNOSIS — J029 Acute pharyngitis, unspecified: Secondary | ICD-10-CM | POA: Diagnosis not present

## 2019-03-27 ENCOUNTER — Other Ambulatory Visit: Payer: Self-pay | Admitting: Cardiovascular Disease

## 2019-04-12 ENCOUNTER — Other Ambulatory Visit: Payer: Self-pay | Admitting: Cardiovascular Disease

## 2019-04-13 ENCOUNTER — Ambulatory Visit (INDEPENDENT_AMBULATORY_CARE_PROVIDER_SITE_OTHER): Payer: BC Managed Care – PPO | Admitting: Cardiology

## 2019-04-13 ENCOUNTER — Other Ambulatory Visit: Payer: Self-pay

## 2019-04-13 ENCOUNTER — Encounter: Payer: Self-pay | Admitting: Cardiology

## 2019-04-13 ENCOUNTER — Encounter (INDEPENDENT_AMBULATORY_CARE_PROVIDER_SITE_OTHER): Payer: Self-pay

## 2019-04-13 VITALS — BP 126/74 | HR 61 | Ht 70.0 in | Wt 176.0 lb

## 2019-04-13 DIAGNOSIS — E785 Hyperlipidemia, unspecified: Secondary | ICD-10-CM | POA: Diagnosis not present

## 2019-04-13 DIAGNOSIS — I2510 Atherosclerotic heart disease of native coronary artery without angina pectoris: Secondary | ICD-10-CM

## 2019-04-13 DIAGNOSIS — I10 Essential (primary) hypertension: Secondary | ICD-10-CM | POA: Diagnosis not present

## 2019-04-13 MED ORDER — ISOSORBIDE MONONITRATE ER 30 MG PO TB24: ORAL_TABLET | ORAL | 3 refills | Status: DC

## 2019-04-13 MED ORDER — SIMVASTATIN 40 MG PO TABS: 40.0000 mg | ORAL_TABLET | Freq: Every day | ORAL | 3 refills | Status: DC

## 2019-04-13 MED ORDER — DILTIAZEM HCL ER COATED BEADS 120 MG PO CP24: ORAL_CAPSULE | ORAL | 3 refills | Status: DC

## 2019-04-13 MED ORDER — METOPROLOL TARTRATE 50 MG PO TABS: 50.0000 mg | ORAL_TABLET | Freq: Two times a day (BID) | ORAL | 3 refills | Status: DC

## 2019-04-13 MED ORDER — NITROGLYCERIN 0.4 MG SL SUBL: 0.4000 mg | SUBLINGUAL_TABLET | SUBLINGUAL | 3 refills | Status: DC | PRN

## 2019-04-13 NOTE — Patient Instructions (Addendum)
Medication Instructions:  Your physician recommends that you continue on your current medications as directed. Please refer to the Current Medication list given to you today.   *If you need a refill on your cardiac medications before your next appointment, please call your pharmacy*  Lab Work: None ordered  If you have labs (blood work) drawn today and your tests are completely normal, you will receive your results only by: Marland Kitchen. MyChart Message (if you have MyChart) OR . A paper copy in the mail If you have any lab test that is abnormal or we need to change your treatment, we will call you to review the results.  Testing/Procedures: None ordered  Follow-Up: At [facility], you and your health needs are our priority.  As part of our continuing mission to provide you with exceptional heart care, we have created designated Provider Care Teams.  These Care Teams include your primary Cardiologist (physician) and Advanced Practice Providers (APPs -  Physician Assistants and Nurse Practitioners) who all work together to provide you with the care you need, when you need it.  Your next appointment:   12 months  The format for your next appointment:   In Person  Provider:   You may see Verne Carrowhristopher McAlhany, MD or one of the following Advanced Practice Providers on your designated Care Team:    Ronie Spiesayna Dunn, PA-C  Jacolyn ReedyMichele Lenze, PA-C   Other Instructions  Lifestyle Modifications to Prevent and Treat Heart Disease -Recommend heart healthy/Mediterranean diet, with whole grains, fruits, vegetables, fish, lean meats, nuts, olive oil and avocado oil.  -Limit salt intake to less than 2000 mg per day.  -Recommend moderate walking, starting slowly with a few minutes and working up to 3-5 times/week for 30-50 minutes each session. Aim for at least 150 minutes.week. Goal should be pace of 3 miles/hours, or walking 1.5 miles in 30 minutes -Recommend avoidance of tobacco products. Avoid excess  alcohol. -Keep blood pressure well controlled, ideally less than 130/80.     Mediterranean Diet A Mediterranean diet refers to food and lifestyle choices that are based on the traditions of countries located on the [place]. This way of eating has been shown to help prevent certain conditions and improve outcomes for people who have chronic diseases, like kidney disease and heart disease. What are tips for following this plan? Lifestyle  Cook and eat meals together with your family, when possible.  Drink enough fluid to keep your urine clear or pale yellow.  Be physically active every day. This includes: ? Aerobic exercise like running or swimming. ? Leisure activities like gardening, walking, or housework.  Get 7-8 hours of sleep each night.  If recommended by your health care provider, drink red wine in moderation. This means 1 glass a day for nonpregnant women and 2 glasses a day for men. A glass of wine equals 5 oz (150 mL). Reading food labels   Check the serving size of packaged foods. For foods such as rice and pasta, the serving size refers to the amount of cooked product, not dry.  Check the total fat in packaged foods. Avoid foods that have saturated fat or trans fats.  Check the ingredients list for added sugars, such as corn syrup. Shopping  At the grocery store, buy most of your food from the areas near the walls of the store. This includes: ? Fresh fruits and vegetables (produce). ? Grains, beans, nuts, and seeds. Some of these may be available in unpackaged forms or large amounts (  in bulk). ? Fresh seafood. ? Poultry and eggs. ? Low-fat dairy products.  Buy whole ingredients instead of prepackaged foods.  Buy fresh fruits and vegetables in-season from local farmers markets.  Buy frozen fruits and vegetables in resealable bags.  If you do not have access to quality fresh seafood, buy precooked frozen shrimp or canned fish, such as tuna, salmon, or  sardines.  Buy small amounts of raw or cooked vegetables, salads, or olives from the deli or salad bar at your store.  Stock your pantry so you always have certain foods on hand, such as olive oil, canned tuna, canned tomatoes, rice, pasta, and beans. Cooking  Cook foods with extra-virgin olive oil instead of using butter or other vegetable oils.  Have meat as a side dish, and have vegetables or grains as your main dish. This means having meat in small portions or adding small amounts of meat to foods like pasta or stew.  Use beans or vegetables instead of meat in common dishes like chili or lasagna.  Experiment with different cooking methods. Try roasting or broiling vegetables instead of steaming or sauteing them.  Add frozen vegetables to soups, stews, pasta, or rice.  Add nuts or seeds for added healthy fat at each meal. You can add these to yogurt, salads, or vegetable dishes.  Marinate fish or vegetables using olive oil, lemon juice, garlic, and fresh herbs. Meal planning   Plan to eat 1 vegetarian meal one day each week. Try to work up to 2 vegetarian meals, if possible.  Eat seafood 2 or more times a week.  Have healthy snacks readily available, such as: ? Vegetable sticks with hummus. ? [place] yogurt. ? Fruit and nut trail mix.  Eat balanced meals throughout the week. This includes: ? Fruit: 2-3 servings a day ? Vegetables: 4-5 servings a day ? Low-fat dairy: 2 servings a day ? Fish, poultry, or lean meat: 1 serving a day ? Beans and legumes: 2 or more servings a week ? Nuts and seeds: 1-2 servings a day ? Whole grains: 6-8 servings a day ? Extra-virgin olive oil: 3-4 servings a day  Limit red meat and sweets to only a few servings a month What are my food choices?  Mediterranean diet ? Recommended  Grains: Whole-grain pasta. Brown rice. Bulgar wheat. Polenta. Couscous. Whole-wheat bread. Orpah Cobb.  Vegetables: Artichokes. Beets. Broccoli. Cabbage.  Carrots. Eggplant. Green beans. Chard. Kale. Spinach. Onions. Leeks. Peas. Squash. Tomatoes. Peppers. Radishes.  Fruits: Apples. Apricots. Avocado. Berries. Bananas. Cherries. Dates. Figs. Grapes. Lemons. Melon. Oranges. Peaches. Plums. Pomegranate.  Meats and other protein foods: Beans. Almonds. Sunflower seeds. Pine nuts. Peanuts. Cod. Salmon. Scallops. Shrimp. Tuna. Tilapia. Clams. Oysters. Eggs.  Dairy: Low-fat milk. Cheese. Greek yogurt.  Beverages: Water. Red wine. Herbal tea.  Fats and oils: Extra virgin olive oil. Avocado oil. Grape seed oil.  Sweets and desserts: [place] yogurt with honey. Baked apples. Poached pears. Trail mix.  Seasoning and other foods: Basil. Cilantro. Coriander. Cumin. Mint. Parsley. Sage. Rosemary. Tarragon. Garlic. Oregano. Thyme. Pepper. Balsalmic vinegar. Tahini. Hummus. Tomato sauce. Olives. Mushrooms. ? Limit these  Grains: Prepackaged pasta or rice dishes. Prepackaged cereal with added sugar.  Vegetables: Deep fried potatoes (french fries).  Fruits: Fruit canned in syrup.  Meats and other protein foods: Beef. Pork. Lamb. Poultry with skin. Hot dogs. Tomasa Blase.  Dairy: Ice cream. Sour cream. Whole milk.  Beverages: Juice. Sugar-sweetened soft drinks. Beer. Liquor and spirits.  Fats and oils: Butter. Canola oil. Vegetable oil.  Beef fat (tallow). Lard.  Sweets and desserts: Cookies. Cakes. Pies. Candy.  Seasoning and other foods: Mayonnaise. Premade sauces and marinades. The items listed may not be a complete list. Talk with your dietitian about what dietary choices are right for you. Summary  The Mediterranean diet includes both food and lifestyle choices.  Eat a variety of fresh fruits and vegetables, beans, nuts, seeds, and whole grains.  Limit the amount of red meat and sweets that you eat.  Talk with your health care provider about whether it is safe for you to drink red wine in moderation. This means 1 glass a day for nonpregnant women and  2 glasses a day for men. A glass of wine equals 5 oz (150 mL). This information is not intended to replace advice given to you by your health care provider. Make sure you discuss any questions you have with your health care provider. Document Released: 01/18/2016 Document Revised: 01/25/2016 Document Reviewed: 01/18/2016 Elsevier Patient Education  2020 [employer].

## 2019-04-13 NOTE — Progress Notes (Signed)
Cardiology Office Note:    Date:  04/13/2019   ID:  CIAN COSTANZO, DOB [birth year], MRN [id number]  PCP:  Patient, No Pcp Per  Cardiologist:  Verne Carrow, MD  Referring MD: No ref. provider found   Chief Complaint  Patient presents with  . Follow-up  . Coronary Artery Disease    History of Present Illness:    LOIC HOBIN is a 73 y.o. male with a past medical history significant for coronary artery disease and hyperlipidemia. He had an anterior MI in 1993. His last cardiac catheterization was in 2003 at which time he had total occlusion of the RCA. Nuclear stress test in January 2016 with chronic scar at the apex but no ischemia. LVEF=49%. He has had pulmonary nodules and renal cysts noted on CT scans in 2014. Repeat CT chest/abdomen in January 2016 with stable disease. Follow up testing not completed due to cost. (Recommendation per radiology to repeat scans in September 2016 to demonstrate stability).    The patient was last seen in the office on 12/12/2017 by Dr. Clifton James and he was doing well with no cardiac symptoms.  It was noted that the patient is established with primary care at the [facility] in [facility].  He was working at [employer] and very active.  The patient was admitted to the hospital on 04/2018 with subdural hematoma due to trauma from a large door falling on him.  He was monitored overnight and continued to be stable.  The patient is here today for annual follow-up of CAD. He stil works full time at [employer]. He walks for about 30 minutes, ~2 miles, at least 4 times per week. He denies chest discomfort or shortness of breath. He has no orthopnea, PND, edema, palpitations, dizziness or syncope.   He says that his labs are followed at the [place].   Cardiac studies   See HPI  Past Medical History:  Diagnosis Date  . Coronary artery disease    MI in 1993, cath 2003 with occluded RCA  . Heart attack (HCC) 1993  . History of kidney stones   .  Hyperlipidemia   . Hypertension   . Subdural hematoma (HCC) 05/06/2018   large door falling on him and striking him in the head; work related injury    Past Surgical History:  Procedure Laterality Date  . APPENDECTOMY    . CHOLECYSTECTOMY OPEN    . CORONARY ANGIOPLASTY WITH STENT PLACEMENT  1993    Current Medications: Current Meds  Medication Sig  . cyclobenzaprine (FLEXERIL) 10 MG tablet Take 1 tablet (10 mg total) by mouth 3 (three) times daily as needed for muscle spasms.  Marland Kitchen diltiazem (CARDIZEM CD) 120 MG 24 hr capsule TAKE 1 TABLET BY MOUTH DAILY  . isosorbide mononitrate (IMDUR) 30 MG 24 hr tablet TAKE 1 TABLET BY MOUTH EVERY DAY NEEDS APPT FOR REFILLS 1ST ATTEMPT  . metoprolol tartrate (LOPRESSOR) 50 MG tablet Take 1 tablet (50 mg total) by mouth 2 (two) times daily.  . nitroGLYCERIN (NITROSTAT) 0.4 MG SL tablet Place 1 tablet (0.4 mg total) under the tongue every 5 (five) minutes as needed for chest pain.  . simvastatin (ZOCOR) 40 MG tablet Take 1 tablet (40 mg total) by mouth daily at 6 PM.  . [DISCONTINUED] diltiazem (CARDIZEM CD) 120 MG 24 hr capsule TAKE 1 CAPSULE BY MOUTH EVERY DAY NEEDS APPT FOR REFILLS 1ST ATTEMPT  . [DISCONTINUED] isosorbide mononitrate (IMDUR) 30 MG 24 hr tablet TAKE 1 TABLET BY MOUTH  EVERY DAY NEEDS APPT FOR REFILLS 1ST ATTEMPT  . [DISCONTINUED] metoprolol tartrate (LOPRESSOR) 50 MG tablet Take 1 tablet (50 mg total) by mouth 2 (two) times daily.  . [DISCONTINUED] nitroGLYCERIN (NITROSTAT) 0.4 MG SL tablet Place 1 tablet (0.4 mg total) under the tongue every 5 (five) minutes as needed for chest pain.  . [DISCONTINUED] simvastatin (ZOCOR) 40 MG tablet Take 1 tablet (40 mg total) by mouth daily at 6 PM.     Allergies:   Patient has no known allergies.   Social History   Socioeconomic History  . Marital status: Married    Spouse name: Not on file  . Number of children: 2  . Years of education: Not on file  . Highest education level: Not on file   Occupational History  . 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 Financial resource strain: Not on file  . Food insecurity    Worry: Not on file    Inability: Not on file  . Transportation needs    Medical: Not on file    Non-medical: Not on file  Tobacco Use  . Smoking status: Former Smoker    Packs/day: 2.00    Years: 25.00    Pack years: 50.00    Types: Cigarettes    Quit date: 06/11/1991    Years since quitting: 27.8  . Smokeless tobacco: Never Used  Substance and Sexual Activity  . Alcohol use: Not Currently    Comment: 05/06/2018 "nothing in the 2000s; never have been a drinker"  . Drug use: Never  . Sexual activity: Yes  Lifestyle  . Physical activity    Days per week: Not on file    Minutes per session: Not on file  . Stress: Not on file  Relationships  . Social Musicianconnections    Talks on phone: Not on file    Gets together: Not on file    Attends religious service: Not on file    Active member of club or organization: Not on file    Attends meetings of clubs or organizations: Not on file    Relationship status: Not on file  Other Topics Concern  . Not on file  Social History Narrative  . Not on file     Family History: The patient's family history includes Heart attack in his brother and father. ROS:   Please see the history of present illness.     All other systems reviewed and are negative.   EKG:  EKG is ordered today.  The ekg ordered today demonstrates normal sinus rhythm, 61 bpm, no changes from prior tracings  Recent Labs: No results found for requested labs within last 8760 hours.   Recent Lipid Panel    Component Value Date/Time   CHOL 105 07/08/2016 0731   TRIG 114 07/08/2016 0731   HDL 37 (L) 07/08/2016 0731   CHOLHDL 2.8 07/08/2016 0731   CHOLHDL 3.1 06/13/2015 0914   VLDL 24 06/13/2015 0914   LDLCALC 45 07/08/2016 0731    Physical Exam:    VS:  BP 126/74   Pulse 61   Ht 5\' 10"  (1.778 m)   Wt 176 lb (79.8 kg)    SpO2 97%   BMI 25.25 kg/m     Wt Readings from Last 6 Encounters:  04/13/19 176 lb (79.8 kg)  05/06/18 182 lb (82.6 kg)  12/12/17 183 lb (83 kg)  07/01/16 186 lb (84.4 kg)  06/13/15 186 lb 12.8 oz (84.7 kg)  06/13/14 184 lb (83.5 kg)     Physical Exam  Constitutional: He is oriented to person, place, and time. He appears well-developed and well-nourished. No distress.  HENT:  Head: Normocephalic and atraumatic.  Neck: Normal range of motion. Neck supple. No JVD present.  Cardiovascular: Normal rate, regular rhythm, normal heart sounds and intact distal pulses. Exam reveals no gallop and no friction rub.  No murmur heard. Pulmonary/Chest: Effort normal and breath sounds normal. No respiratory distress. He has no wheezes. He has no rales.  Abdominal: Soft. Bowel sounds are normal.  Musculoskeletal: Normal range of motion.        General: No edema.  Neurological: He is alert and oriented to person, place, and time.  Skin: Skin is warm and dry.  Psychiatric: He has a normal mood and affect. His behavior is normal. Judgment and thought content normal.  Vitals reviewed.    ASSESSMENT:    1. Coronary artery disease involving native coronary artery of native heart without angina pectoris   2. Hyperlipidemia, unspecified hyperlipidemia type   3. Essential (primary) hypertension    PLAN:    In order of problems listed above:  Coronary artery disease without angina -Patient continues on aspirin, statin, beta-blocker and Imdur. -Patient is quite active with no anginal symptoms.  Hyperlipidemia -On simvastatin 40 mg daily -Lipids followed at the [place].  Will request most recent lipid levels. Our fax number provided to pt for labs to be faxed. He thinks he may have the lab results at home and if he does he will bring them to our office to copy.   Hypertension -On diltiazem 120 mg daily, Imdur 30 mg daily, metoprolol tartrate 50 mg twice daily -BP well controlled.   Pulmonary  nodule/renal mass -He says that he got his follow up CT scan last month at the [place] and also an MRI. He says that he was told that his tests were good and he is for repeat scan in a year and if stable at that time then could stop testing.     Medication Adjustments/Labs and Tests Ordered: Current medicines are reviewed at length with the patient today.  Concerns regarding medicines are outlined above. Labs and tests ordered and medication changes are outlined in the patient instructions below:  Patient Instructions  Medication Instructions:  Your physician recommends that you continue on your current medications as directed. Please refer to the Current Medication list given to you today.   *If you need a refill on your cardiac medications before your next appointment, please call your pharmacy*  Lab Work: None ordered  If you have labs (blood work) drawn today and your tests are completely normal, you will receive your results only by: Marland Kitchen MyChart Message (if you have MyChart) OR . A paper copy in the mail If you have any lab test that is abnormal or we need to change your treatment, we will call you to review the results.  Testing/Procedures: None ordered  Follow-Up: At [facility], you and your health needs are our priority.  As part of our continuing mission to provide you with exceptional heart care, we have created designated Provider Care Teams.  These Care Teams include your primary Cardiologist (physician) and Advanced Practice Providers (APPs -  Physician Assistants and Nurse Practitioners) who all work together to provide you with the care you need, when you need it.  Your next appointment:   12 months  The format for your next appointment:   In Person  Provider:   You may see Verne Carrow, MD or one of the following Advanced Practice Providers on your designated Care Team:    Ronie Spies, PA-C  Jacolyn Reedy, PA-C   Other Instructions  Lifestyle  Modifications to Prevent and Treat Heart Disease -Recommend heart healthy/Mediterranean diet, with whole grains, fruits, vegetables, fish, lean meats, nuts, olive oil and avocado oil.  -Limit salt intake to less than 2000 mg per day.  -Recommend moderate walking, starting slowly with a few minutes and working up to 3-5 times/week for 30-50 minutes each session. Aim for at least 150 minutes.week. Goal should be pace of 3 miles/hours, or walking 1.5 miles in 30 minutes -Recommend avoidance of tobacco products. Avoid excess alcohol. -Keep blood pressure well controlled, ideally less than 130/80.     Mediterranean Diet A Mediterranean diet refers to food and lifestyle choices that are based on the traditions of countries located on the [place]. This way of eating has been shown to help prevent certain conditions and improve outcomes for people who have chronic diseases, like kidney disease and heart disease. What are tips for following this plan? Lifestyle  Cook and eat meals together with your family, when possible.  Drink enough fluid to keep your urine clear or pale yellow.  Be physically active every day. This includes: ? Aerobic exercise like running or swimming. ? Leisure activities like gardening, walking, or housework.  Get 7-8 hours of sleep each night.  If recommended by your health care provider, drink red wine in moderation. This means 1 glass a day for nonpregnant women and 2 glasses a day for men. A glass of wine equals 5 oz (150 mL). Reading food labels   Check the serving size of packaged foods. For foods such as rice and pasta, the serving size refers to the amount of cooked product, not dry.  Check the total fat in packaged foods. Avoid foods that have saturated fat or trans fats.  Check the ingredients list for added sugars, such as corn syrup. Shopping  At the grocery store, buy most of your food from the areas near the walls of the store. This includes:  ? Fresh fruits and vegetables (produce). ? Grains, beans, nuts, and seeds. Some of these may be available in unpackaged forms or large amounts (in bulk). ? Fresh seafood. ? Poultry and eggs. ? Low-fat dairy products.  Buy whole ingredients instead of prepackaged foods.  Buy fresh fruits and vegetables in-season from local farmers markets.  Buy frozen fruits and vegetables in resealable bags.  If you do not have access to quality fresh seafood, buy precooked frozen shrimp or canned fish, such as tuna, salmon, or sardines.  Buy small amounts of raw or cooked vegetables, salads, or olives from the deli or salad bar at your store.  Stock your pantry so you always have certain foods on hand, such as olive oil, canned tuna, canned tomatoes, rice, pasta, and beans. Cooking  Cook foods with extra-virgin olive oil instead of using butter or other vegetable oils.  Have meat as a side dish, and have vegetables or grains as your main dish. This means having meat in small portions or adding small amounts of meat to foods like pasta or stew.  Use beans or vegetables instead of meat in common dishes like chili or lasagna.  Experiment with different cooking methods. Try roasting or broiling vegetables instead of steaming or sauteing them.  Add frozen vegetables to soups, stews, pasta, or rice.  Add  nuts or seeds for added healthy fat at each meal. You can add these to yogurt, salads, or vegetable dishes.  Marinate fish or vegetables using olive oil, lemon juice, garlic, and fresh herbs. Meal planning   Plan to eat 1 vegetarian meal one day each week. Try to work up to 2 vegetarian meals, if possible.  Eat seafood 2 or more times a week.  Have healthy snacks readily available, such as: ? Vegetable sticks with hummus. ? [place] yogurt. ? Fruit and nut trail mix.  Eat balanced meals throughout the week. This includes: ? Fruit: 2-3 servings a day ? Vegetables: 4-5 servings a day ? Low-fat  dairy: 2 servings a day ? Fish, poultry, or lean meat: 1 serving a day ? Beans and legumes: 2 or more servings a week ? Nuts and seeds: 1-2 servings a day ? Whole grains: 6-8 servings a day ? Extra-virgin olive oil: 3-4 servings a day  Limit red meat and sweets to only a few servings a month What are my food choices?  Mediterranean diet ? Recommended  Grains: Whole-grain pasta. Brown rice. Bulgar wheat. Polenta. Couscous. Whole-wheat bread. [place].  Vegetables: Artichokes. Beets. Broccoli. Cabbage. Carrots. Eggplant. Green beans. Chard. Kale. Spinach. Onions. Leeks. Peas. Squash. Tomatoes. Peppers. Radishes.  Fruits: Apples. Apricots. Avocado. Berries. Bananas. Cherries. Dates. Figs. Grapes. Lemons. Melon. Oranges. Peaches. Plums. Pomegranate.  Meats and other protein foods: Beans. Almonds. Sunflower seeds. Pine nuts. Peanuts. [place]. Salmon. Scallops. Shrimp. [place]. Tilapia. Clams. Oysters. Eggs.  Dairy: Low-fat milk. Cheese. Greek yogurt.  Beverages: Water. Red wine. Herbal tea.  Fats and oils: Extra virgin olive oil. Avocado oil. Grape seed oil.  Sweets and desserts: [place] yogurt with honey. Baked apples. Poached pears. Trail mix.  Seasoning and other foods: Basil. Cilantro. Coriander. Cumin. Mint. Parsley. Sage. Rosemary. Tarragon. Garlic. Oregano. Thyme. Pepper. Balsalmic vinegar. Tahini. Hummus. Tomato sauce. Olives. Mushrooms. ? Limit these  Grains: Prepackaged pasta or rice dishes. Prepackaged cereal with added sugar.  Vegetables: Deep fried potatoes (french fries).  Fruits: Fruit canned in syrup.  Meats and other protein foods: Beef. Pork. Lamb. Poultry with skin. Hot dogs. Berniece Salines.  Dairy: Ice cream. Sour cream. Whole milk.  Beverages: Juice. Sugar-sweetened soft drinks. Beer. Liquor and spirits.  Fats and oils: Butter. Canola oil. Vegetable oil. Beef fat (tallow). Lard.  Sweets and desserts: Cookies. Cakes. Pies. Candy.  Seasoning and other foods:  Mayonnaise. Premade sauces and marinades. The items listed may not be a complete list. Talk with your dietitian about what dietary choices are right for you. Summary  The Mediterranean diet includes both food and lifestyle choices.  Eat a variety of fresh fruits and vegetables, beans, nuts, seeds, and whole grains.  Limit the amount of red meat and sweets that you eat.  Talk with your health care provider about whether it is safe for you to drink red wine in moderation. This means 1 glass a day for nonpregnant women and 2 glasses a day for men. A glass of wine equals 5 oz (150 mL). This information is not intended to replace advice given to you by your health care provider. Make sure you discuss any questions you have with your health care provider. Document Released: 01/18/2016 Document Revised: 01/25/2016 Document Reviewed: 01/18/2016 Elsevier Patient Education  2020 [place], Daune Perch, NP  04/13/2019 5:13 PM    [place]

## 2020-04-10 ENCOUNTER — Other Ambulatory Visit: Payer: Self-pay

## 2020-04-10 MED ORDER — ISOSORBIDE MONONITRATE ER 30 MG PO TB24: 30.0000 mg | ORAL_TABLET | Freq: Every day | ORAL | 0 refills | Status: DC

## 2020-04-10 MED ORDER — DILTIAZEM HCL ER COATED BEADS 120 MG PO CP24: 120.0000 mg | ORAL_CAPSULE | Freq: Every day | ORAL | 0 refills | Status: DC

## 2020-04-17 NOTE — Progress Notes (Signed)
Cardiology Office Note    Date:  04/19/2020   ID:  Jeffrey Lyons, DOB 01/05/1946, MRN [id number]  PCP:  Patient, No Pcp Per  Cardiologist: Verne Carrowhristopher McAlhany, MD EPS: None  Chief Complaint  Patient presents with  . Follow-up    History of Present Illness:  Jeffrey Lyons is a 74 y.o. male with history of CAD status post MI in 1993, last cath 2003 total RCA, NST 2016 no ischemia LVEF 49%, history of hypertension, HLD, subdural hematoma in 2019 due to trauma when the door fell on him, pulmonary nodules and renal mass followed at the [facility].  Patient comes in for yearly f/u. Still works at [employer]. Walks 2 miles everyday. Denies chest pain, dyspnea, dizziness, palpitations, edema.[facility]   Past Medical History:  Diagnosis Date  . Coronary artery disease    MI in 1993, cath 2003 with occluded RCA  . Heart attack (HCC) 1993  . History of kidney stones   . Hyperlipidemia   . Hypertension   . Subdural hematoma (HCC) 05/06/2018   large door falling on him and striking him in the head; work related injury    Past Surgical History:  Procedure Laterality Date  . APPENDECTOMY    . CHOLECYSTECTOMY OPEN    . CORONARY ANGIOPLASTY WITH STENT PLACEMENT  1993    Current Medications: Current Meds  Medication Sig  . cyclobenzaprine (FLEXERIL) 10 MG tablet Take 1 tablet (10 mg total) by mouth 3 (three) times daily as needed for muscle spasms.  Marland Kitchen. diltiazem (CARDIZEM CD) 120 MG 24 hr capsule Take 1 capsule (120 mg total) by mouth daily. TAKE 1 TABLET BY MOUTH DAILY  . isosorbide mononitrate (IMDUR) 30 MG 24 hr tablet Take 1 tablet (30 mg total) by mouth daily.  . metoprolol tartrate (LOPRESSOR) 50 MG tablet Take 1 tablet (50 mg total) by mouth 2 (two) times daily.  . nitroGLYCERIN (NITROSTAT) 0.4 MG SL tablet Place 1 tablet (0.4 mg total) under the tongue every 5 (five) minutes as needed for chest pain.  . simvastatin (ZOCOR) 40 MG tablet Take 1 tablet (40 mg  total) by mouth daily at 6 PM.  . [DISCONTINUED] diltiazem (CARDIZEM CD) 120 MG 24 hr capsule Take 1 capsule (120 mg total) by mouth daily. TAKE 1 TABLET BY MOUTH DAILY  . [DISCONTINUED] isosorbide mononitrate (IMDUR) 30 MG 24 hr tablet Take 1 tablet (30 mg total) by mouth daily.  . [DISCONTINUED] metoprolol tartrate (LOPRESSOR) 50 MG tablet Take 1 tablet (50 mg total) by mouth 2 (two) times daily.  . [DISCONTINUED] nitroGLYCERIN (NITROSTAT) 0.4 MG SL tablet Place 1 tablet (0.4 mg total) under the tongue every 5 (five) minutes as needed for chest pain.  . [DISCONTINUED] simvastatin (ZOCOR) 40 MG tablet Take 1 tablet (40 mg total) by mouth daily at 6 PM.     Allergies:   Patient has no known allergies.   Social History   Socioeconomic History  . Marital status: Married    Spouse name: Not on file  . Number of children: 2  . Years of education: Not on file  . Highest education level: Not on file  Occupational History  . Occupation: SHIPPING    Employer: [employer]  Tobacco Use  . Smoking status: Former Smoker    Packs/day: 2.00    Years: 25.00    Pack years: 50.00    Types: Cigarettes    Quit date: 06/11/1991    Years since quitting:  28.8  . Smokeless tobacco: Never Used  Vaping Use  . Vaping Use: Never used  Substance and Sexual Activity  . Alcohol use: Not Currently    Comment: 05/06/2018 "nothing in the 2000s; never have been a drinker"  . Drug use: Never  . Sexual activity: Yes  Other Topics Concern  . Not on file  Social History Narrative  . Not on file   Social Determinants of Health   Financial Resource Strain:   . Difficulty of Paying Living Expenses: Not on file  Food Insecurity:   . Worried About Programme researcher, broadcasting/film/video in the Last Year: Not on file  . Ran Out of Food in the Last Year: Not on file  Transportation Needs:   . Lack of Transportation (Medical): Not on file  . Lack of Transportation (Non-Medical): Not on file  Physical Activity:   . Days of Exercise per  Week: Not on file  . Minutes of Exercise per Session: Not on file  Stress:   . Feeling of Stress : Not on file  Social Connections:   . Frequency of Communication with Friends and Family: Not on file  . Frequency of Social Gatherings with Friends and Family: Not on file  . Attends Religious Services: Not on file  . Active Member of Clubs or Organizations: Not on file  . Attends Banker Meetings: Not on file  . Marital Status: Not on file     Family History:  The patient's family history includes Heart attack in his brother and father.   ROS:   Please see the history of present illness.    ROS All other systems reviewed and are negative.   PHYSICAL EXAM:   VS:  BP 140/82   Pulse (!) 56   Ht  (1.778 m)   Wt 177 lb 6.4 oz (80.5 kg)   SpO2 95%   BMI 25.45 kg/m   Physical Exam  GEN: Thin, in no acute distress  Neck: no JVD, carotid bruits, or masses Cardiac:RRR; no murmurs, rubs, or gallops  Respiratory:  clear to auscultation bilaterally, normal work of breathing GI: soft, nontender, nondistended, + BS Ext: without cyanosis, clubbing, or edema, Good distal pulses bilaterally Neuro:  Alert and Oriented x 3 Psych: euthymic mood, full affect  Wt Readings from Last 3 Encounters:  04/19/20 177 lb 6.4 oz (80.5 kg)  04/13/19 176 lb (79.8 kg)  05/06/18 182 lb (82.6 kg)      Studies/Labs Reviewed:   EKG:  EKG is  ordered today.  The ekg ordered today demonstrates normal sinus rhythm with T wave inversion V1 through V2 unchanged  Recent Labs: No results found for requested labs within last 8760 hours.   Lipid Panel    Component Value Date/Time   CHOL 105 07/08/2016 0731   TRIG 114 07/08/2016 0731   HDL 37 (L) 07/08/2016 0731   CHOLHDL 2.8 07/08/2016 0731   CHOLHDL 3.1 06/13/2015 0914   VLDL 24 06/13/2015 0914   LDLCALC 45 07/08/2016 0731    Additional studies/ records that were reviewed today include:       ASSESSMENT:    1. Coronary artery  disease involving native coronary artery of native heart without angina pectoris   2. Subdural hematoma (HCC)   3. Essential hypertension   4. Hyperlipidemia, unspecified hyperlipidemia type   5. Pulmonary nodules      PLAN:  In order of problems listed above:  CAD status post anterior MI 1993 last cath  in 2003 total RCA, NST 2016 no ischemia LVEF 49%-no angina, on ASA, zocor, metoprolol, imdur, diltiazem  History of subdural hematoma due to trauma  Hypertension-BP has been control-a little high today. Gets extra salt so will cut back and keep track of his BP  Hyperlipidemia on the statin [facility]  Pulmonary nodules/renal [facility]  Medication Adjustments/Labs and Tests Ordered: Current medicines are reviewed at length with the patient today.  Concerns regarding medicines are outlined above.  Medication changes, Labs and Tests ordered today are listed in the Patient Instructions below. Patient Instructions   Medication Instructions:  Your physician recommends that you continue on your current medications as directed. Please refer to the Current Medication list given to you today.  *If you need a refill on your cardiac medications before your next appointment, please call your pharmacy*   Lab Work: None If you have labs (blood work) drawn today and your tests are completely normal, you will receive your results only by: Marland Kitchen MyChart Message (if you have MyChart) OR . A paper copy in the mail If you have any lab test that is abnormal or we need to change your treatment, we will call you to review the results.   Follow-Up: At [facility], you and your health needs are our priority.  As part of our continuing mission to provide you with exceptional heart care, we have created designated Provider Care Teams.  These Care Teams include your primary Cardiologist (physician) and Advanced Practice Providers (APPs -  Physician Assistants and Nurse Practitioners)  who all work together to provide you with the care you need, when you need it.  We recommend signing up for the patient portal called "MyChart".  Sign up information is provided on this After Visit Summary.  MyChart is used to connect with patients for Virtual Visits (Telemedicine).  Patients are able to view lab/test results, encounter notes, upcoming appointments, etc.  Non-urgent messages can be sent to your provider as well.   To learn more about what you can do with MyChart, go to [email or web address].    Your next appointment:   1 year(s)  The format for your next appointment:   In Person  Provider:   You may see Verne Carrow, MD or one of the following Advanced Practice Providers on your designated Care Team:    Ronie Spies, PA-C  Jacolyn Reedy, PA-C    Other Instructions Keep a check on your blood pressure. If it stays above 135/85 call and let us know.   Two Gram Sodium Diet 2000 mg  What is Sodium? Sodium is a mineral found naturally in many foods. The most significant source of sodium in the diet is table salt, which is about 40% sodium.  Processed, convenience, and preserved foods also contain a large amount of sodium.  The body needs only 500 mg of sodium daily to function,  A normal diet provides more than enough sodium even if you do not use salt.  Why Limit Sodium? A build up of sodium in the body can cause thirst, increased blood pressure, shortness of breath, and water retention.  Decreasing sodium in the diet can reduce edema and risk of heart attack or stroke associated with high blood pressure.  Keep in mind that there are many other factors involved in these health problems.  Heredity, obesity, lack of exercise, cigarette smoking, stress and what you eat all play a role.  General Guidelines:  Do not add salt  at the table or in cooking.  One teaspoon of salt contains over 2 grams of sodium.  Read food labels  Avoid processed and convenience  foods  Ask your dietitian before eating any foods not dicussed in the menu planning guidelines  Consult your physician if you wish to use a salt substitute or a sodium containing medication such as antacids.  Limit milk and milk products to 16 oz (2 cups) per day.  Shopping Hints:  READ LABELS!! "Dietetic" does not necessarily mean low sodium.  Salt and other sodium ingredients are often added to foods during processing.   Menu Planning Guidelines Food Group Choose More Often Avoid  Beverages (see also the milk group All fruit juices, low-sodium, salt-free vegetables juices, low-sodium carbonated beverages Regular vegetable or tomato juices, commercially softened water used for drinking or cooking  Breads and Cereals Enriched white, wheat, rye and pumpernickel bread, hard rolls and dinner rolls; muffins, cornbread and waffles; most dry cereals, cooked cereal without added salt; unsalted crackers and breadsticks; low sodium or homemade bread crumbs Bread, rolls and crackers with salted tops; quick breads; instant hot cereals; pancakes; commercial bread stuffing; self-rising flower and biscuit mixes; regular bread crumbs or cracker crumbs  Desserts and Sweets Desserts and sweets mad with mild should be within allowance Instant pudding mixes and cake mixes  Fats Butter or margarine; vegetable oils; unsalted salad dressings, regular salad dressings limited to 1 Tbs; light, sour and heavy cream Regular salad dressings containing bacon fat, bacon bits, and salt pork; snack dips made with instant soup mixes or processed cheese; salted nuts  Fruits Most fresh, frozen and canned fruits Fruits processed with salt or sodium-containing ingredient (some dried fruits are processed with sodium sulfites        Vegetables Fresh, frozen vegetables and low- sodium canned vegetables Regular canned vegetables, sauerkraut, pickled vegetables, and others prepared in brine; frozen vegetables in sauces; vegetables  seasoned with ham, bacon or salt pork  Condiments, Sauces, Miscellaneous  Salt substitute with physician's approval; pepper, herbs, spices; vinegar, lemon or lime juice; hot pepper sauce; garlic powder, onion powder, low sodium soy sauce (1 Tbs.); low sodium condiments (ketchup, chili sauce, mustard) in limited amounts (1 tsp.) fresh ground horseradish; unsalted tortilla chips, pretzels, potato chips, popcorn, salsa (1/4 cup) Any seasoning made with salt including garlic salt, celery salt, onion salt, and seasoned salt; sea salt, rock salt, kosher salt; meat tenderizers; monosodium glutamate; mustard, regular soy sauce, barbecue, sauce, chili sauce, teriyaki sauce, steak sauce, Worcestershire sauce, and most flavored vinegars; canned gravy and mixes; regular condiments; salted snack foods, olives, picles, relish, horseradish sauce, catsup   Food preparation: Try these seasonings Meats:    Pork Sage, onion Serve with applesauce  Chicken Poultry seasoning, thyme, parsley Serve with cranberry sauce  Lamb Curry powder, rosemary, garlic, thyme Serve with mint sauce or jelly  Veal Marjoram, basil Serve with current jelly, cranberry sauce  Beef Pepper, bay leaf Serve with dry mustard, unsalted chive butter  Fish [place] leaf, dill Serve with unsalted lemon butter, unsalted parsley butter  Vegetables:    Asparagus Lemon juice   Broccoli Lemon juice   Carrots Mustard dressing parsley, mint, nutmeg, glazed with unsalted butter and sugar   Green beans Marjoram, lemon juice, nutmeg,dill seed   Tomatoes Basil, marjoram, onion   Spice /blend for [employer]" 4 tsp ground thyme 1 tsp ground sage 3 tsp ground rosemary 4 tsp ground marjoram   Test your knowledge 1. A product that says "  Salt Free" may still contain sodium. True or False 2. Garlic Powder and Hot Pepper Sauce an be used as alternative seasonings.True or False 3. Processed foods have more sodium than fresh foods.  True or False 4. Canned  Vegetables have less sodium than froze True or False  WAYS TO DECREASE YOUR SODIUM INTAKE 1. Avoid the use of added salt in cooking and at the table.  Table salt (and other prepared seasonings which contain salt) is probably one of the greatest sources of sodium in the diet.  Unsalted foods can gain flavor from the sweet, sour, and butter taste sensations of herbs and spices.  Instead of using salt for seasoning, try the following seasonings with the foods listed.  Remember: how you use them to enhance natural food flavors is limited only by your creativity... Allspice-Meat, fish, eggs, fruit, peas, red and yellow vegetables Almond Extract-Fruit baked goods Anise Seed-Sweet breads, fruit, carrots, beets, cottage cheese, cookies (tastes like licorice) Basil-Meat, fish, eggs, vegetables, rice, vegetables salads, soups, sauces Bay Leaf-Meat, fish, stews, poultry Burnet-Salad, vegetables (cucumber-like flavor) Caraway Seed-Bread, cookies, cottage cheese, meat, vegetables, cheese, rice Cardamon-Baked goods, fruit, soups Celery Powder or seed-Salads, salad dressings, sauces, meatloaf, soup, bread.Do not use  celery salt Chervil-Meats, salads, fish, eggs, vegetables, cottage cheese (parsley-like flavor) Chili Power-Meatloaf, chicken cheese, corn, eggplant, egg dishes Chives-Salads cottage cheese, egg dishes, soups, vegetables, sauces Cilantro-Salsa, casseroles Cinnamon-Baked goods, fruit, pork, lamb, chicken, carrots Cloves-Fruit, baked goods, fish, pot roast, green beans, beets, carrots Coriander-Pastry, cookies, meat, salads, cheese (lemon-orange flavor) Cumin-Meatloaf, fish,cheese, eggs, cabbage,fruit pie (caraway flavor) [employer], fruit, eggs, fish, poultry, cottage cheese, vegetables Dill Seed-Meat, cottage cheese, poultry, vegetables, fish, salads, bread Fennel Seed-Bread, cookies, apples, pork, eggs, fish, beets, cabbage, cheese, Licorice-like flavor Garlic-(buds or powder) Salads,  meat, poultry, fish, bread, butter, vegetables, potatoes.Do not  use garlic salt Ginger-Fruit, vegetables, baked goods, meat, fish, poultry Horseradish Root-Meet, vegetables, butter Lemon Juice or Extract-Vegetables, fruit, tea, baked goods, fish salads Mace-Baked goods fruit, vegetables, fish, poultry (taste like nutmeg) Maple Extract-Syrups Marjoram-Meat, chicken, fish, vegetables, breads, green salads (taste like Sage) Mint-Tea, lamb, sherbet, vegetables, desserts, carrots, cabbage Mustard, Dry or Seed-Cheese, eggs, meats, vegetables, poultry Nutmeg-Baked goods, fruit, chicken, eggs, vegetables, desserts Onion Powder-Meat, fish, poultry, vegetables, cheese, eggs, bread, rice salads (Do not use   Onion salt) Orange Extract-Desserts, baked goods Oregano-Pasta, eggs, cheese, onions, pork, lamb, fish, chicken, vegetables, green salads Paprika-Meat, fish, poultry, eggs, cheese, vegetables Parsley Flakes-Butter, vegetables, meat fish, poultry, eggs, bread, salads (certain forms may   Contain sodium Pepper-Meat fish, poultry, vegetables, eggs Peppermint Extract-Desserts, baked goods Poppy Seed-Eggs, bread, cheese, fruit dressings, baked goods, noodles, vegetables, cottage  [employer], poultry, meat, fish, cauliflower, turnips,eggs bread Saffron-Rice, bread, veal, chicken, fish, eggs Sage-Meat, fish, poultry, onions, eggplant, tomateos, pork, stews Savory-Eggs, salads, poultry, meat, rice, vegetables, soups, pork Tarragon-Meat, poultry, fish, eggs, butter, vegetables (licorice-like flavor)  Thyme-Meat, poultry, fish, eggs, vegetables, (clover-like flavor), sauces, soups Tumeric-Salads, butter, eggs, fish, rice, vegetables (saffron-like flavor) Vanilla Extract-Baked goods, candy Vinegar-Salads, vegetables, meat marinades Walnut Extract-baked goods, candy  2. Choose your Foods Wisely   The following is a list of foods to avoid which are high in  sodium:  Meats-Avoid all smoked, canned, salt cured, dried and kosher meat and fish as well as Anchovies   [employer] meats:Bologna, Liverwurst, Pastrami Canned meat or fish  Marinated herring Caviar    Pepperoni Corned Beef   Pizza Dried chipped beef  Salami Frozen  breaded fish or meat Salt pork Frankfurters or hot dogs  Sardines Gefilte fish   Sausage Ham (boiled ham, Proscuitto Smoked butt    spiced ham)   Spam      TV Dinners Vegetables Canned vegetables (Regular) Relish Canned mushrooms  Sauerkraut Olives    Tomato juice Pickles  Bakery and Dessert Products Canned puddings  Cream pies Cheesecake   Decorated cakes Cookies  Beverages/Juices Tomato juice, regular  Gatorade   V-8 vegetable juice, regular  Breads and Cereals Biscuit mixes   Salted potato chips, corn chips, pretzels Bread stuffing mixes  Salted crackers and rolls Pancake and waffle mixes Self-rising flour  Seasonings Accent    Meat sauces Barbecue sauce  Meat tenderizer Catsup    Monosodium glutamate (MSG) Celery salt   Onion salt Chili sauce   Prepared mustard Garlic salt   Salt, seasoned salt, sea salt Gravy mixes   Soy sauce Horseradish   Steak sauce Ketchup   Tartar sauce Lite salt    Teriyaki sauce Marinade mixes   Worcestershire sauce  Others Baking powder   Cocoa and cocoa mixes Baking soda   Commercial casserole mixes Candy-caramels, chocolate  Dehydrated soups    Bars, fudge,nougats  Instant rice and pasta mixes Canned broth or soup  Maraschino cherries Cheese, aged and processed cheese and cheese spreads  Learning Assessment Quiz  Indicated T (for True) or F (for False) for each of the following statements:  1. _____ Fresh fruits and vegetables and unprocessed grains are generally low in sodium 2. _____ Water may contain a considerable amount of sodium, depending on the source 3. _____ You can always tell if a food is high in sodium by tasting it 4. _____ Certain  laxatives my be high in sodium and should be avoided unless prescribed   by a physician or pharmacist 5. _____ Salt substitutes may be used freely by anyone on a sodium restricted diet 6. _____ Sodium is present in table salt, food additives and as a natural component of   most foods 7. _____ Table salt is approximately 90% sodium 8. _____ Limiting sodium intake may help prevent excess fluid accumulation in the body 9. _____ On a sodium-restricted diet, seasonings such as bouillon soy sauce, and    cooking wine should be used in place of table salt 10. _____ On an ingredient list, a product which lists monosodium glutamate as the first   ingredient is an appropriate food to include on a low sodium diet  Circle the best answer(s) to the following statements (Hint: there may be more than one correct answer)  11. On a low-sodium diet, some acceptable snack items are:    A. Olives  F. Bean dip   K. Grapefruit juice    B. Salted Pretzels G. Commercial Popcorn   L. Canned peaches    C. Carrot Sticks  H. Bouillon   M. Unsalted nuts   D. [place] fries  I. Peanut butter crackers N. Salami   E. Sweet pickles J. Tomato Juice   O. Pizza  12.  Seasonings that may be used freely on a reduced - sodium diet include   A. Lemon wedges F.Monosodium glutamate K. Celery seed    B.Soysauce   G. Pepper   L. Mustard powder   C. Sea salt  H. Cooking wine  M. Onion flakes   D. Vinegar  E. Prepared horseradish N. Salsa   E. Sage   J. Worcestershire sauce  O. Chutney  Signed, Jacolyn Reedy, PA-C  04/19/2020 1:17 PM    [facility] [address] [place], [place], [place]  [address] Phone: [phone]; Fax: [phone]

## 2020-04-19 ENCOUNTER — Other Ambulatory Visit: Payer: Self-pay

## 2020-04-19 ENCOUNTER — Ambulatory Visit (INDEPENDENT_AMBULATORY_CARE_PROVIDER_SITE_OTHER): Payer: BC Managed Care – PPO | Admitting: Physician Assistant

## 2020-04-19 ENCOUNTER — Encounter: Payer: Self-pay | Admitting: Physician Assistant

## 2020-04-19 VITALS — BP 140/82 | HR 56 | Ht 70.0 in | Wt 177.4 lb

## 2020-04-19 DIAGNOSIS — E785 Hyperlipidemia, unspecified: Secondary | ICD-10-CM | POA: Diagnosis not present

## 2020-04-19 DIAGNOSIS — S065X9A Traumatic subdural hemorrhage with loss of consciousness of unspecified duration, initial encounter: Secondary | ICD-10-CM

## 2020-04-19 DIAGNOSIS — S065XAA Traumatic subdural hemorrhage with loss of consciousness status unknown, initial encounter: Secondary | ICD-10-CM

## 2020-04-19 DIAGNOSIS — I2510 Atherosclerotic heart disease of native coronary artery without angina pectoris: Secondary | ICD-10-CM | POA: Diagnosis not present

## 2020-04-19 DIAGNOSIS — R918 Other nonspecific abnormal finding of lung field: Secondary | ICD-10-CM

## 2020-04-19 DIAGNOSIS — I10 Essential (primary) hypertension: Secondary | ICD-10-CM | POA: Diagnosis not present

## 2020-04-19 MED ORDER — METOPROLOL TARTRATE 50 MG PO TABS: 50.0000 mg | ORAL_TABLET | Freq: Two times a day (BID) | ORAL | 3 refills | Status: DC

## 2020-04-19 MED ORDER — NITROGLYCERIN 0.4 MG SL SUBL: 0.4000 mg | SUBLINGUAL_TABLET | SUBLINGUAL | 3 refills | Status: AC | PRN

## 2020-04-19 MED ORDER — ISOSORBIDE MONONITRATE ER 30 MG PO TB24: 30.0000 mg | ORAL_TABLET | Freq: Every day | ORAL | 3 refills | Status: DC

## 2020-04-19 MED ORDER — SIMVASTATIN 40 MG PO TABS: 40.0000 mg | ORAL_TABLET | Freq: Every day | ORAL | 3 refills | Status: DC

## 2020-04-19 MED ORDER — DILTIAZEM HCL ER COATED BEADS 120 MG PO CP24: 120.0000 mg | ORAL_CAPSULE | Freq: Every day | ORAL | 3 refills | Status: DC

## 2020-04-19 NOTE — Patient Instructions (Signed)
Medication Instructions:  Your physician recommends that you continue on your current medications as directed. Please refer to the Current Medication list given to you today.  *If you need a refill on your cardiac medications before your next appointment, please call your pharmacy*   Lab Work: None If you have labs (blood work) drawn today and your tests are completely normal, you will receive your results only by: Marland Kitchen MyChart Message (if you have MyChart) OR . A paper copy in the mail If you have any lab test that is abnormal or we need to change your treatment, we will call you to review the results.   Follow-Up: At [facility], you and your health needs are our priority.  As part of our continuing mission to provide you with exceptional heart care, we have created designated Provider Care Teams.  These Care Teams include your primary Cardiologist (physician) and Advanced Practice Providers (APPs -  Physician Assistants and Nurse Practitioners) who all work together to provide you with the care you need, when you need it.  We recommend signing up for the patient portal called "MyChart".  Sign up information is provided on this After Visit Summary.  MyChart is used to connect with patients for Virtual Visits (Telemedicine).  Patients are able to view lab/test results, encounter notes, upcoming appointments, etc.  Non-urgent messages can be sent to your provider as well.   To learn more about what you can do with MyChart, go to [email or web address].    Your next appointment:   1 year(s)  The format for your next appointment:   In Person  Provider:   You may see Verne Carrow, MD or one of the following Advanced Practice Providers on your designated Care Team:    Ronie Spies, PA-C  Jacolyn Reedy, PA-C    Other Instructions Keep a check on your blood pressure. If it stays above 135/85 call and let us know.   Two Gram Sodium Diet 2000 mg  What is Sodium? Sodium is a  mineral found naturally in many foods. The most significant source of sodium in the diet is table salt, which is about 40% sodium.  Processed, convenience, and preserved foods also contain a large amount of sodium.  The body needs only 500 mg of sodium daily to function,  A normal diet provides more than enough sodium even if you do not use salt.  Why Limit Sodium? A build up of sodium in the body can cause thirst, increased blood pressure, shortness of breath, and water retention.  Decreasing sodium in the diet can reduce edema and risk of heart attack or stroke associated with high blood pressure.  Keep in mind that there are many other factors involved in these health problems.  Heredity, obesity, lack of exercise, cigarette smoking, stress and what you eat all play a role.  General Guidelines:  Do not add salt at the table or in cooking.  One teaspoon of salt contains over 2 grams of sodium.  Read food labels  Avoid processed and convenience foods  Ask your dietitian before eating any foods not dicussed in the menu planning guidelines  Consult your physician if you wish to use a salt substitute or a sodium containing medication such as antacids.  Limit milk and milk products to 16 oz (2 cups) per day.  Shopping Hints:  READ LABELS!! "Dietetic" does not necessarily mean low sodium.  Salt and other sodium ingredients are often added to foods during processing.   Menu Planning  Guidelines Food Group Choose More Often Avoid  Beverages (see also the milk group All fruit juices, low-sodium, salt-free vegetables juices, low-sodium carbonated beverages Regular vegetable or tomato juices, commercially softened water used for drinking or cooking  Breads and Cereals Enriched white, wheat, rye and pumpernickel bread, hard rolls and dinner rolls; muffins, cornbread and waffles; most dry cereals, cooked cereal without added salt; unsalted crackers and breadsticks; low sodium or homemade bread crumbs  Bread, rolls and crackers with salted tops; quick breads; instant hot cereals; pancakes; commercial bread stuffing; self-rising flower and biscuit mixes; regular bread crumbs or cracker crumbs  Desserts and Sweets Desserts and sweets mad with mild should be within allowance Instant pudding mixes and cake mixes  Fats Butter or margarine; vegetable oils; unsalted salad dressings, regular salad dressings limited to 1 Tbs; light, sour and heavy cream Regular salad dressings containing bacon fat, bacon bits, and salt pork; snack dips made with instant soup mixes or processed cheese; salted nuts  Fruits Most fresh, frozen and canned fruits Fruits processed with salt or sodium-containing ingredient (some dried fruits are processed with sodium sulfites        Vegetables Fresh, frozen vegetables and low- sodium canned vegetables Regular canned vegetables, sauerkraut, pickled vegetables, and others prepared in brine; frozen vegetables in sauces; vegetables seasoned with ham, bacon or salt pork  Condiments, Sauces, Miscellaneous  Salt substitute with physician's approval; pepper, herbs, spices; vinegar, lemon or lime juice; hot pepper sauce; garlic powder, onion powder, low sodium soy sauce (1 Tbs.); low sodium condiments (ketchup, chili sauce, mustard) in limited amounts (1 tsp.) fresh ground horseradish; unsalted tortilla chips, pretzels, potato chips, popcorn, salsa (1/4 cup) Any seasoning made with salt including garlic salt, celery salt, onion salt, and seasoned salt; sea salt, rock salt, kosher salt; meat tenderizers; monosodium glutamate; mustard, regular soy sauce, barbecue, sauce, chili sauce, teriyaki sauce, steak sauce, Worcestershire sauce, and most flavored vinegars; canned gravy and mixes; regular condiments; salted snack foods, olives, picles, relish, horseradish sauce, catsup   Food preparation: Try these seasonings Meats:    Pork Sage, onion Serve with applesauce  Chicken Poultry seasoning,  thyme, parsley Serve with cranberry sauce  Lamb Curry powder, rosemary, garlic, thyme Serve with mint sauce or jelly  Veal Marjoram, basil Serve with current jelly, cranberry sauce  Beef Pepper, bay leaf Serve with dry mustard, unsalted chive butter  Fish Bay leaf, dill Serve with unsalted lemon butter, unsalted parsley butter  Vegetables:    Asparagus Lemon juice   Broccoli Lemon juice   Carrots Mustard dressing parsley, mint, nutmeg, glazed with unsalted butter and sugar   Green beans Marjoram, lemon juice, nutmeg,dill seed   Tomatoes Basil, marjoram, onion   Spice /blend for [employer]"Salt Shaker" 4 tsp ground thyme 1 tsp ground sage 3 tsp ground rosemary 4 tsp ground marjoram   Test your knowledge 1. A product that says "Salt Free" may still contain sodium. True or False 2. Garlic Powder and Hot Pepper Sauce an be used as alternative seasonings.True or False 3. Processed foods have more sodium than fresh foods.  True or False 4. Canned Vegetables have less sodium than froze True or False  WAYS TO DECREASE YOUR SODIUM INTAKE 1. Avoid the use of added salt in cooking and at the table.  Table salt (and other prepared seasonings which contain salt) is probably one of the greatest sources of sodium in the diet.  Unsalted foods can gain flavor from the sweet, sour, and butter taste sensations of  herbs and spices.  Instead of using salt for seasoning, try the following seasonings with the foods listed.  Remember: how you use them to enhance natural food flavors is limited only by your creativity... Allspice-Meat, fish, eggs, fruit, peas, red and yellow vegetables Almond Extract-Fruit baked goods Anise Seed-Sweet breads, fruit, carrots, beets, cottage cheese, cookies (tastes like licorice) Basil-Meat, fish, eggs, vegetables, rice, vegetables salads, soups, sauces Bay Leaf-Meat, fish, stews, poultry Burnet-Salad, vegetables (cucumber-like flavor) Caraway Seed-Bread, cookies, cottage cheese, meat,  vegetables, cheese, rice Cardamon-Baked goods, fruit, soups Celery Powder or seed-Salads, salad dressings, sauces, meatloaf, soup, bread.Do not use  celery salt Chervil-Meats, salads, fish, eggs, vegetables, cottage cheese (parsley-like flavor) Chili Power-Meatloaf, chicken cheese, corn, eggplant, egg dishes Chives-Salads cottage cheese, egg dishes, soups, vegetables, sauces Cilantro-Salsa, casseroles Cinnamon-Baked goods, fruit, pork, lamb, chicken, carrots Cloves-Fruit, baked goods, fish, pot roast, green beans, beets, carrots Coriander-Pastry, cookies, meat, salads, cheese (lemon-orange flavor) Cumin-Meatloaf, fish,cheese, eggs, cabbage,fruit pie (caraway flavor) [employer], fruit, eggs, fish, poultry, cottage cheese, vegetables Dill Seed-Meat, cottage cheese, poultry, vegetables, fish, salads, bread Fennel Seed-Bread, cookies, apples, pork, eggs, fish, beets, cabbage, cheese, Licorice-like flavor Garlic-(buds or powder) Salads, meat, poultry, fish, bread, butter, vegetables, potatoes.Do not  use garlic salt Ginger-Fruit, vegetables, baked goods, meat, fish, poultry Horseradish Root-Meet, vegetables, butter Lemon Juice or Extract-Vegetables, fruit, tea, baked goods, fish salads Mace-Baked goods fruit, vegetables, fish, poultry (taste like nutmeg) Maple Extract-Syrups Marjoram-Meat, chicken, fish, vegetables, breads, green salads (taste like Sage) Mint-Tea, lamb, sherbet, vegetables, desserts, carrots, cabbage Mustard, Dry or Seed-Cheese, eggs, meats, vegetables, poultry Nutmeg-Baked goods, fruit, chicken, eggs, vegetables, desserts Onion Powder-Meat, fish, poultry, vegetables, cheese, eggs, bread, rice salads (Do not use   Onion salt) Orange Extract-Desserts, baked goods Oregano-Pasta, eggs, cheese, onions, pork, lamb, fish, chicken, vegetables, green salads Paprika-Meat, fish, poultry, eggs, cheese, vegetables Parsley Flakes-Butter, vegetables, meat fish, poultry, eggs, bread,  salads (certain forms may   Contain sodium Pepper-Meat fish, poultry, vegetables, eggs Peppermint Extract-Desserts, baked goods Poppy Seed-Eggs, bread, cheese, fruit dressings, baked goods, noodles, vegetables, cottage  [employer], poultry, meat, fish, cauliflower, turnips,eggs bread Saffron-Rice, bread, veal, chicken, fish, eggs Sage-Meat, fish, poultry, onions, eggplant, tomateos, pork, stews Savory-Eggs, salads, poultry, meat, rice, vegetables, soups, pork Tarragon-Meat, poultry, fish, eggs, butter, vegetables (licorice-like flavor)  Thyme-Meat, poultry, fish, eggs, vegetables, (clover-like flavor), sauces, soups Tumeric-Salads, butter, eggs, fish, rice, vegetables (saffron-like flavor) Vanilla Extract-Baked goods, candy Vinegar-Salads, vegetables, meat marinades Walnut Extract-baked goods, candy  2. Choose your Foods Wisely   The following is a list of foods to avoid which are high in sodium:  Meats-Avoid all smoked, canned, salt cured, dried and kosher meat and fish as well as Anchovies   [employer] meats:Bologna, Liverwurst, Pastrami Canned meat or fish  Marinated herring Caviar    Pepperoni Corned Beef   Pizza Dried chipped beef  Salami Frozen breaded fish or meat Salt pork Frankfurters or hot dogs  Sardines Gefilte fish   Sausage Ham (boiled ham, Proscuitto Smoked butt    spiced ham)   Spam      TV Dinners Vegetables Canned vegetables (Regular) Relish Canned mushrooms  Sauerkraut Olives    Tomato juice Pickles  Bakery and Dessert Products Canned puddings  Cream pies Cheesecake   Decorated cakes Cookies  Beverages/Juices Tomato juice, regular  Gatorade   V-8 vegetable juice, regular  Breads and Cereals Biscuit mixes   Salted potato chips, corn chips, pretzels Bread stuffing mixes  Salted crackers and rolls Pancake  and waffle mixes Self-rising flour  Seasonings Accent    Meat sauces Barbecue sauce  Meat  tenderizer Catsup    Monosodium glutamate (MSG) Celery salt   Onion salt Chili sauce   Prepared mustard Garlic salt   Salt, seasoned salt, sea salt Gravy mixes   Soy sauce Horseradish   Steak sauce Ketchup   Tartar sauce Lite salt    Teriyaki sauce Marinade mixes   Worcestershire sauce  Others Baking powder   Cocoa and cocoa mixes Baking soda   Commercial casserole mixes Candy-caramels, chocolate  Dehydrated soups    Bars, fudge,nougats  Instant rice and pasta mixes Canned broth or soup  Maraschino cherries Cheese, aged and processed cheese and cheese spreads  Learning Assessment Quiz  Indicated T (for True) or F (for False) for each of the following statements:  1. _____ Fresh fruits and vegetables and unprocessed grains are generally low in sodium 2. _____ Water may contain a considerable amount of sodium, depending on the source 3. _____ You can always tell if a food is high in sodium by tasting it 4. _____ Certain laxatives my be high in sodium and should be avoided unless prescribed   by a physician or pharmacist 5. _____ Salt substitutes may be used freely by anyone on a sodium restricted diet 6. _____ Sodium is present in table salt, food additives and as a natural component of   most foods 7. _____ Table salt is approximately 90% sodium 8. _____ Limiting sodium intake may help prevent excess fluid accumulation in the body 9. _____ On a sodium-restricted diet, seasonings such as bouillon soy sauce, and    cooking wine should be used in place of table salt 10. _____ On an ingredient list, a product which lists monosodium glutamate as the first   ingredient is an appropriate food to include on a low sodium diet  Circle the best answer(s) to the following statements (Hint: there may be more than one correct answer)  11. On a low-sodium diet, some acceptable snack items are:    A. Olives  F. Bean dip   K. Grapefruit juice    B. Salted Pretzels G. Commercial Popcorn   L.  Canned peaches    C. Carrot Sticks  H. Bouillon   M. Unsalted nuts   D. [place] fries  I. Peanut butter crackers N. Salami   E. Sweet pickles J. Tomato Juice   O. Pizza  12.  Seasonings that may be used freely on a reduced - sodium diet include   A. Lemon wedges F.Monosodium glutamate K. Celery seed    B.Soysauce   G. Pepper   L. Mustard powder   C. Sea salt  H. Cooking wine  M. Onion flakes   D. Vinegar  E. Prepared horseradish N. Salsa   E. Sage   J. Worcestershire sauce  O. Chutney

## 2020-04-24 NOTE — Addendum Note (Signed)
Addended by: Cleda Mccreedy on: 04/24/2020 07:57 AM   Modules accepted: Orders

## 2020-06-16 IMAGING — CR DG PELVIS 1-2V
1 series · 1 of 1 positions shown · non-contrast
Comparison: None.

CLINICAL DATA: Left hip pain after injury at work.

EXAM:
PELVIS - 1-2 VIEW

[pelvis ap]
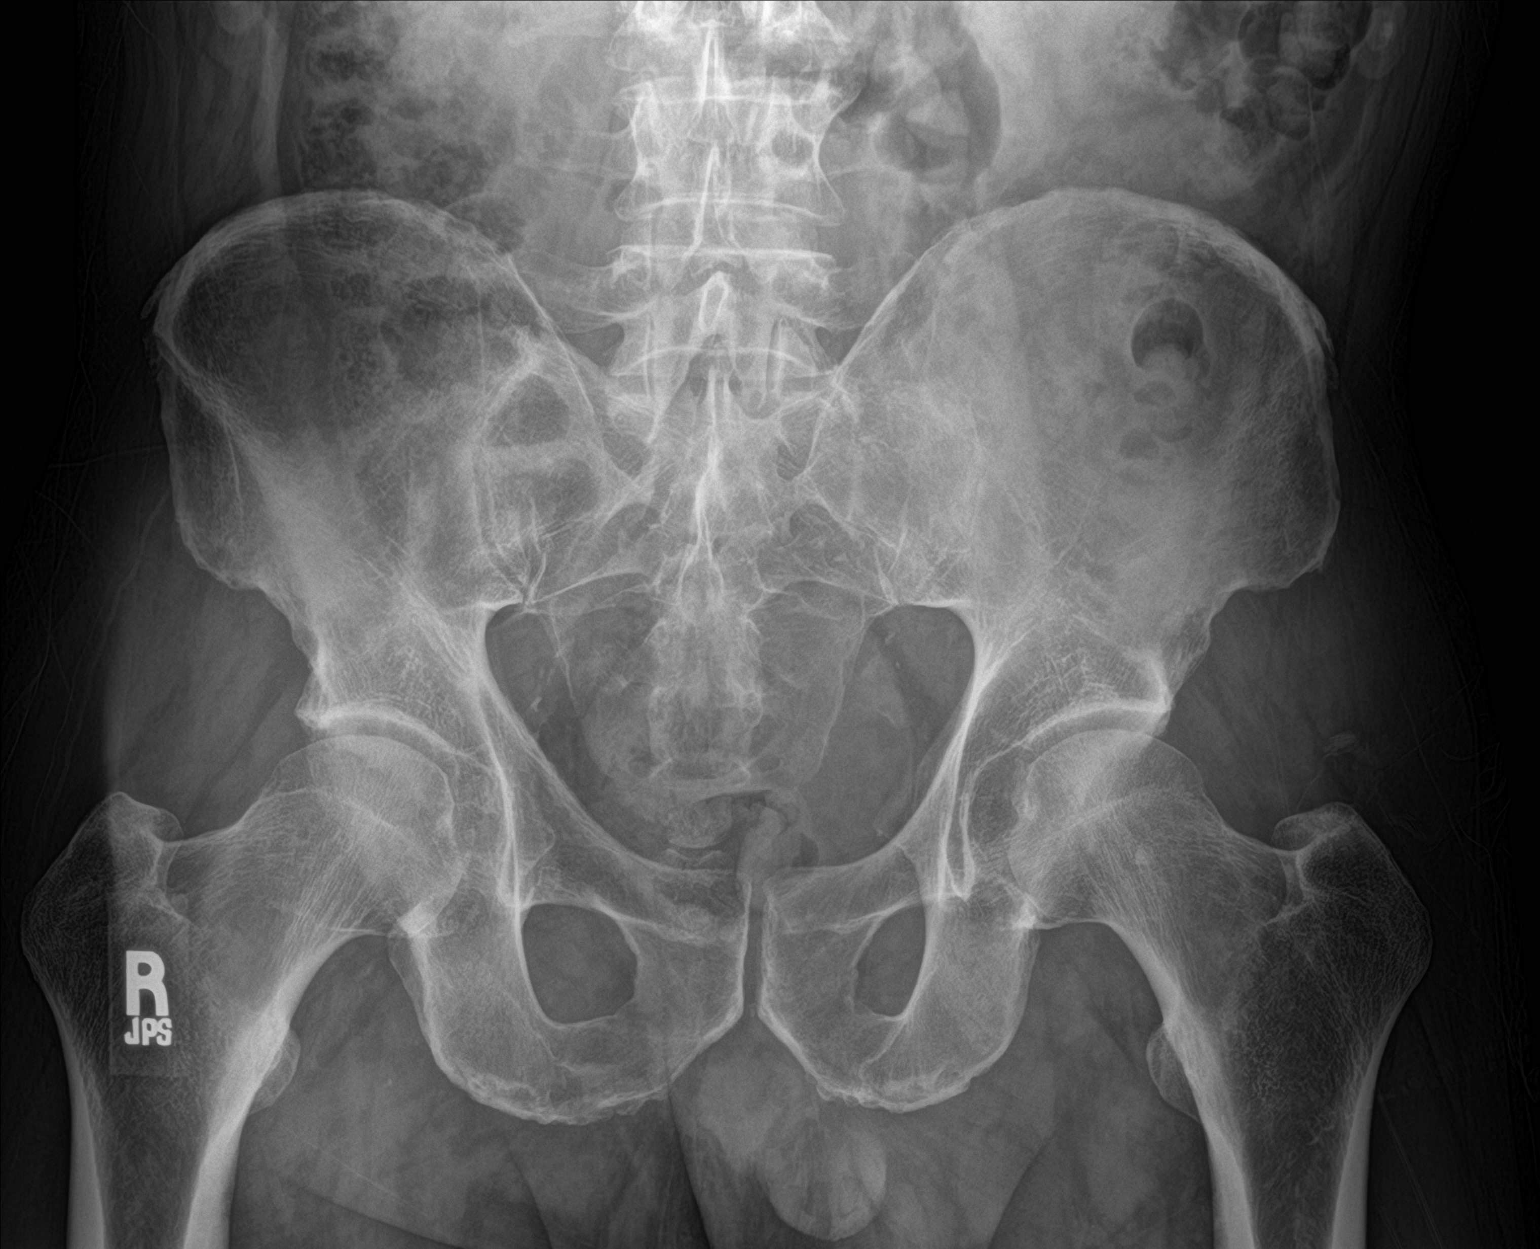

[1 of 1 positions shown; findings below may reference images not displayed]

FINDINGS: There is no evidence of pelvic fracture or diastasis. No pelvic bone
lesions are seen.
IMPRESSION: Negative.

## 2020-06-16 IMAGING — CR DG CHEST 2V
2 series · 2 of 2 positions shown · non-contrast
Comparison: CT of the chest 06/20/2014.

CLINICAL DATA: Injury while on loading doors from a truck.

EXAM:
CHEST - 2 VIEW

[chest pa]
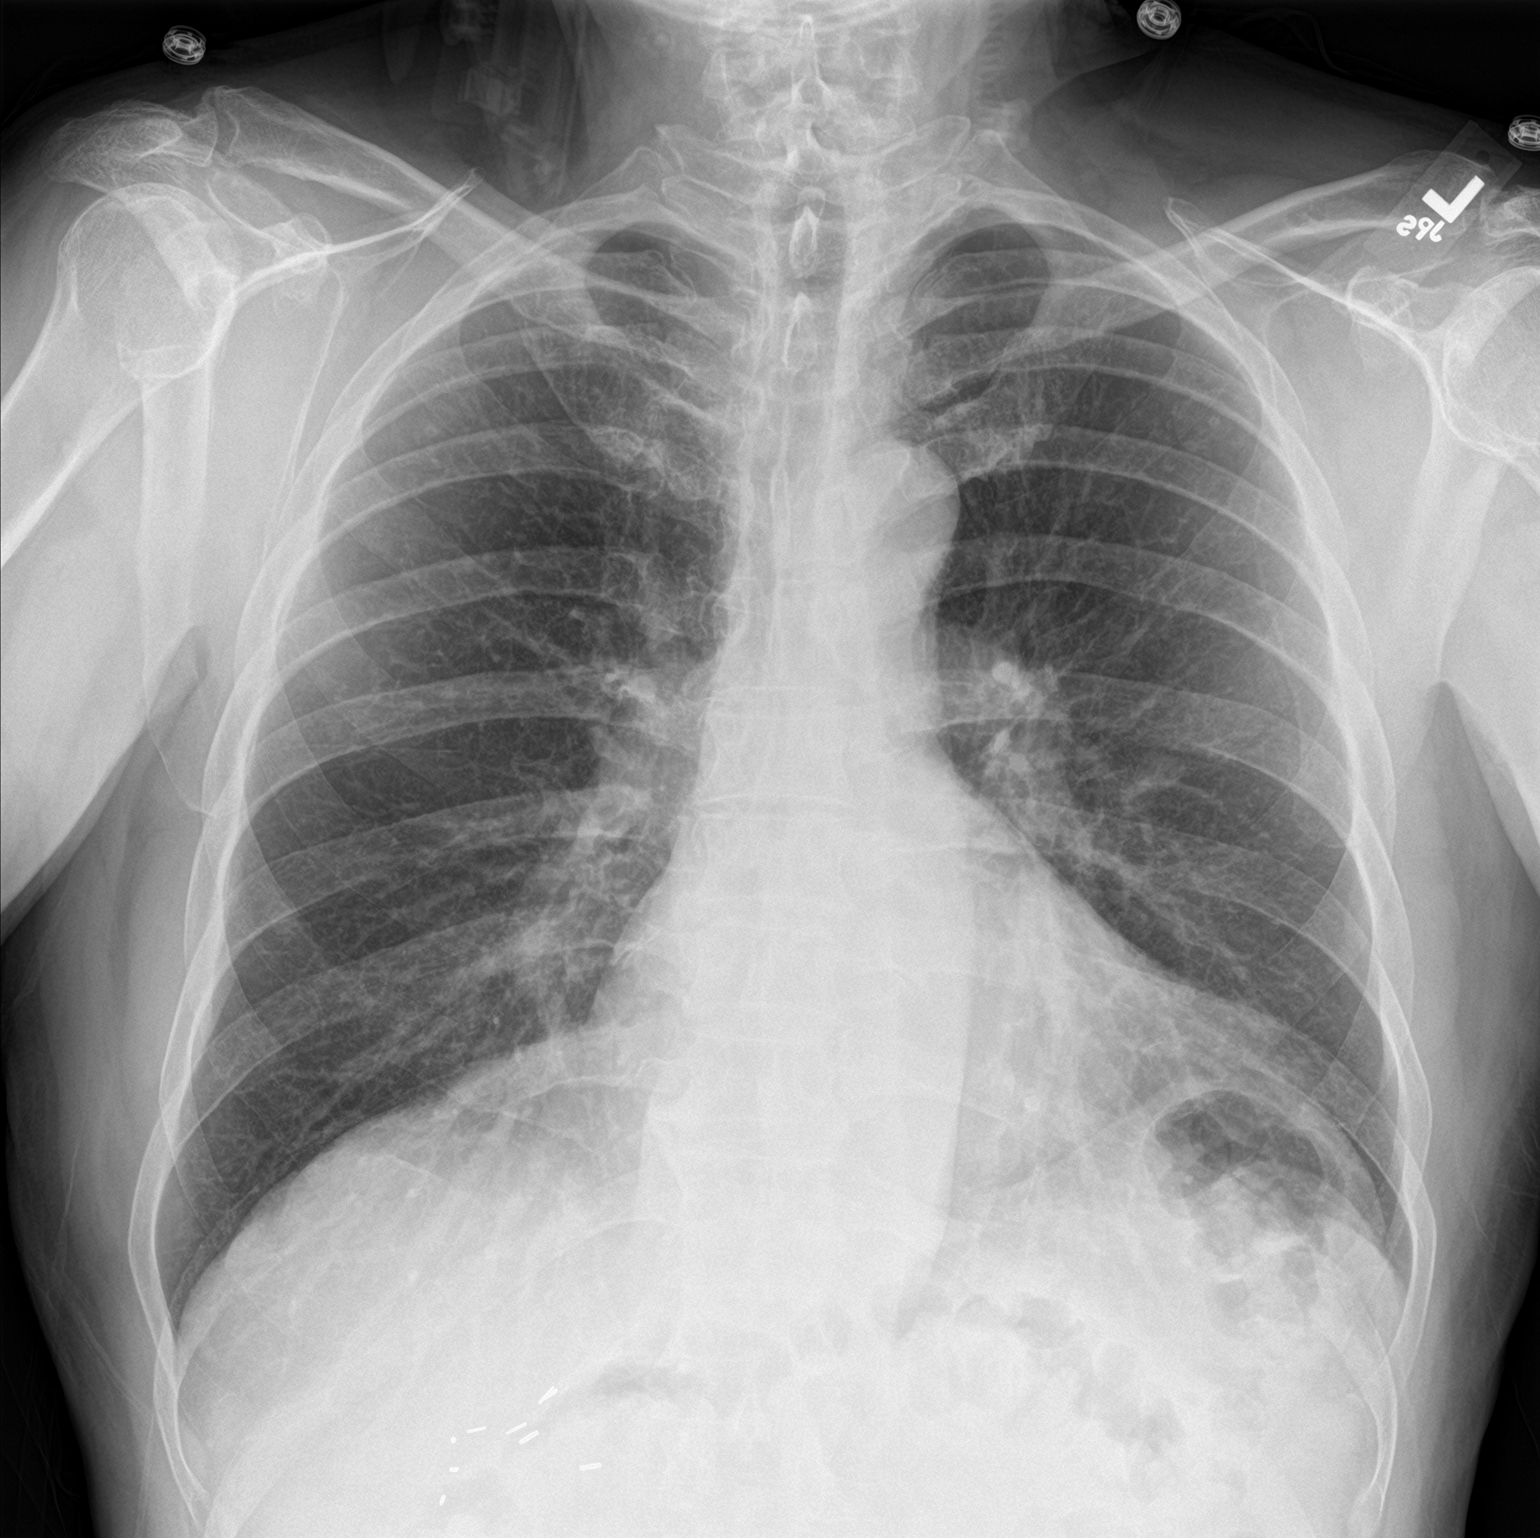

[chest lat]
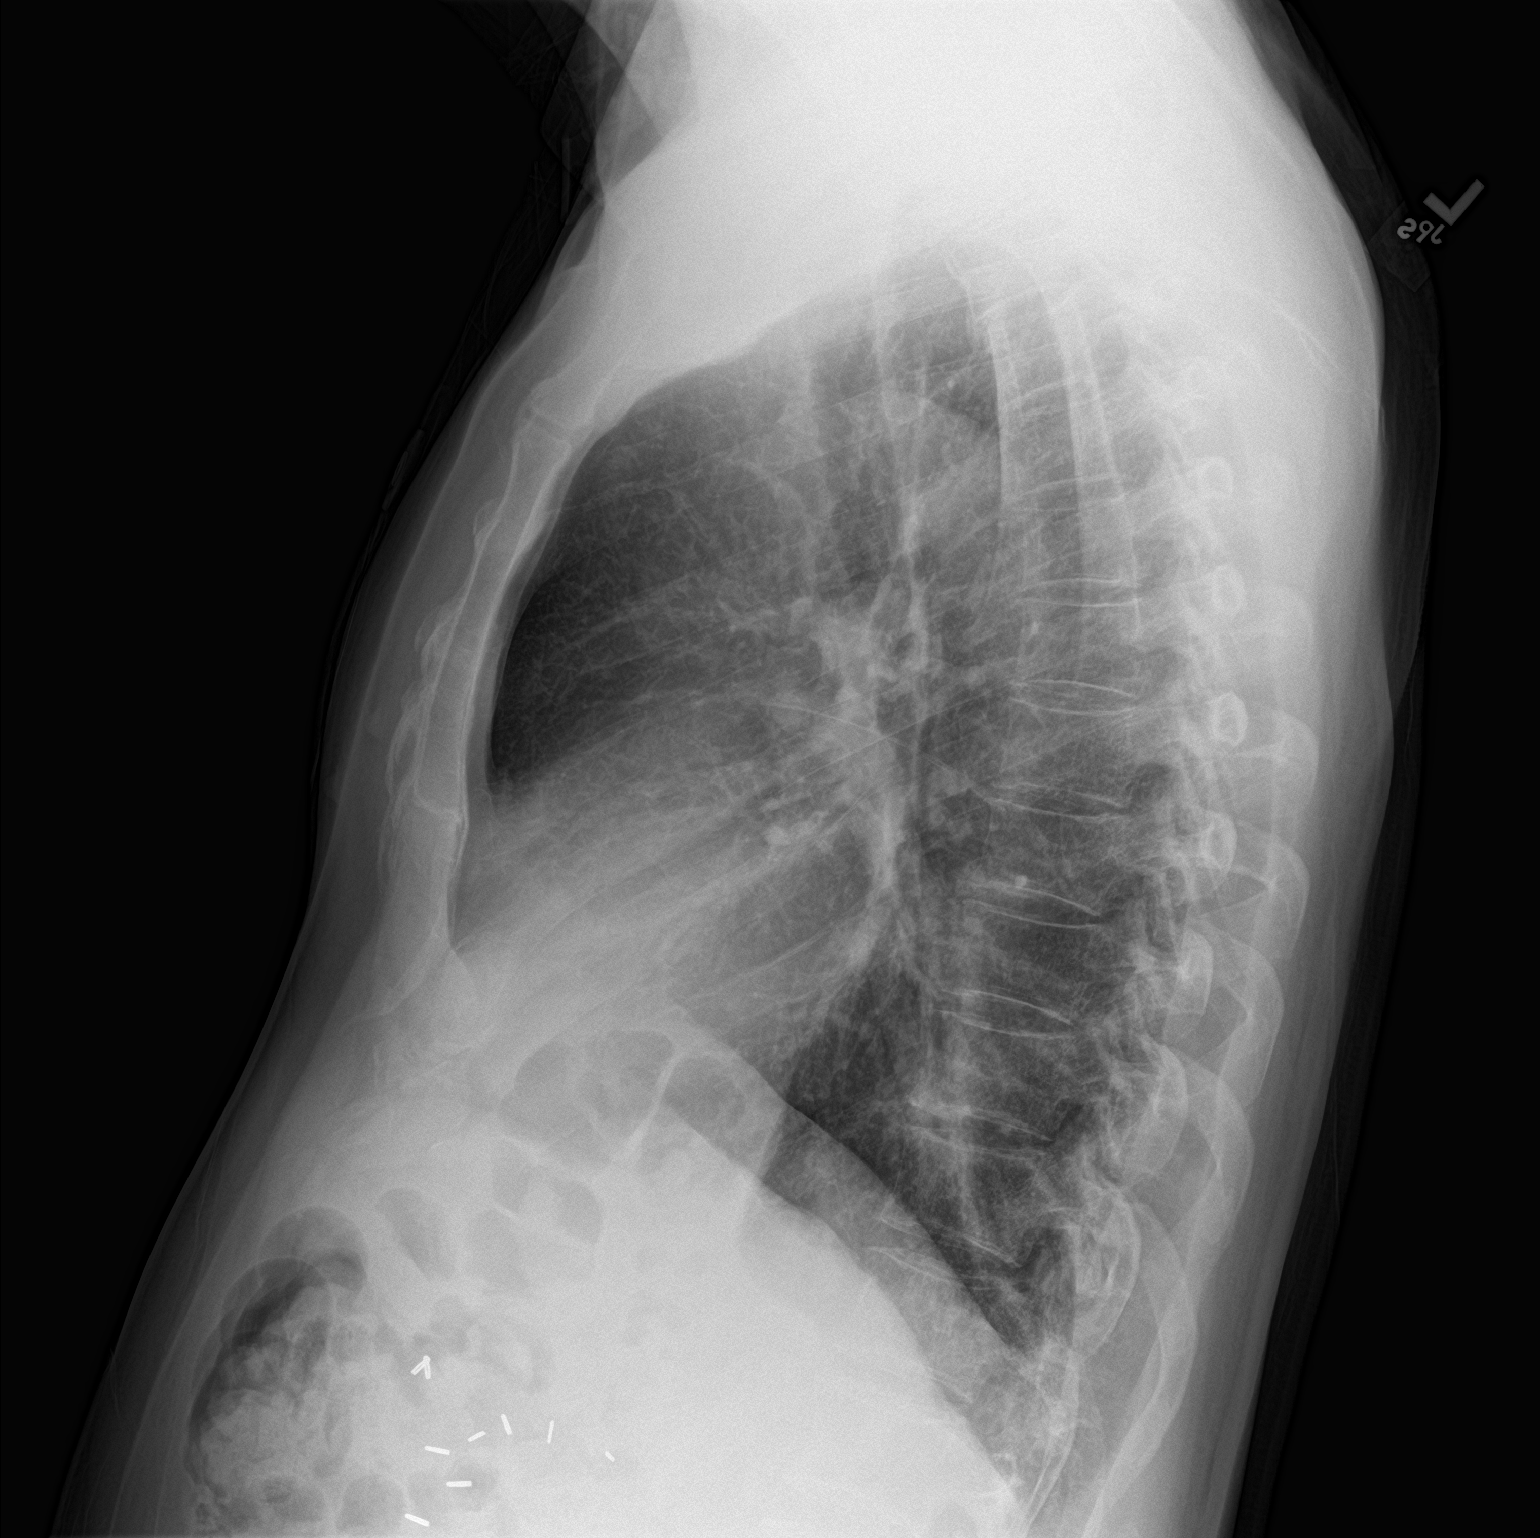

[2 of 2 positions shown; findings below may reference images not displayed]

FINDINGS: Heart size is normal. Lungs are clear. There is no edema or
effusion. Surgical clips are present at the gallbladder fossa.
Moderate degenerative changes are noted at the AC joints
bilaterally.
IMPRESSION: No active cardiopulmonary disease.

## 2020-06-16 IMAGING — CT CT CERVICAL SPINE W/O CM
3 of 4 series · 14 of 33 positions shown, 17 images · non-contrast
Comparison: None.

CLINICAL DATA: Heavy object fell on head.

EXAM:
CT HEAD WITHOUT CONTRAST
CT CERVICAL SPINE WITHOUT CONTRAST
TECHNIQUE: Multidetector CT imaging of the head and cervical spine was
performed following the standard protocol without intravenous
contrast. Multiplanar CT image reconstructions of the cervical spine
were also generated.

[Series 4: head bone · axial · 0.47mm/px · z∈[-210,-76]mm · 6 of 87 slices shown, 8 images]
[im 10/87  soft-tissue]
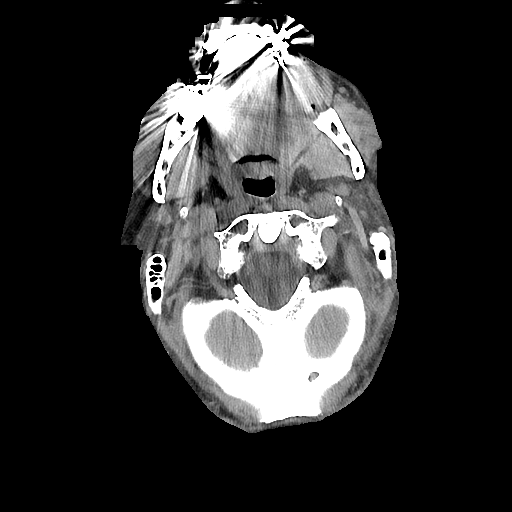
[im 10/87  bone]
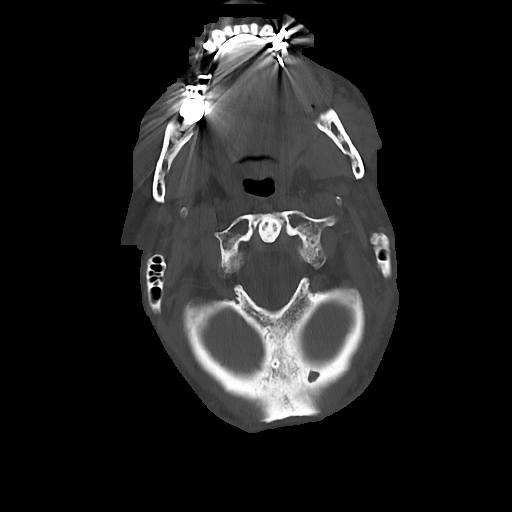
[im 29/87  bone]
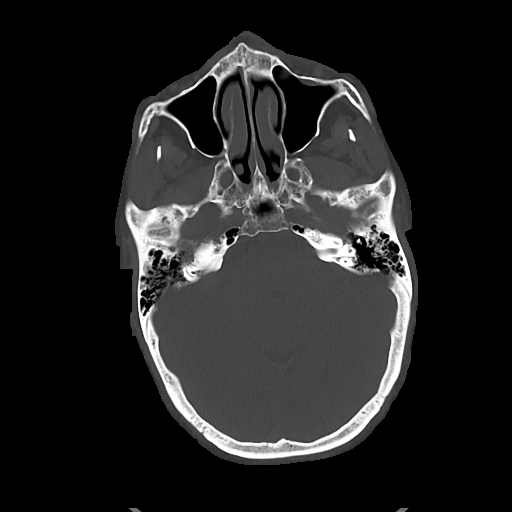
[im 39/87  bone]
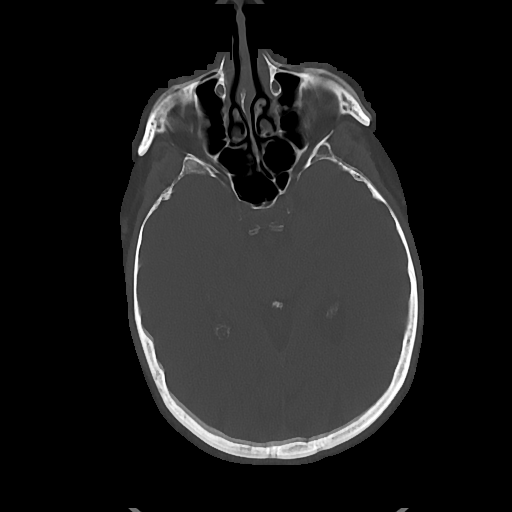
[im 48/87  bone]
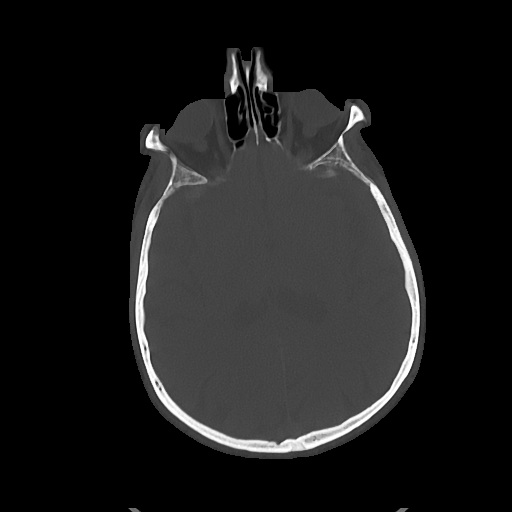
[im 67/87  soft-tissue]
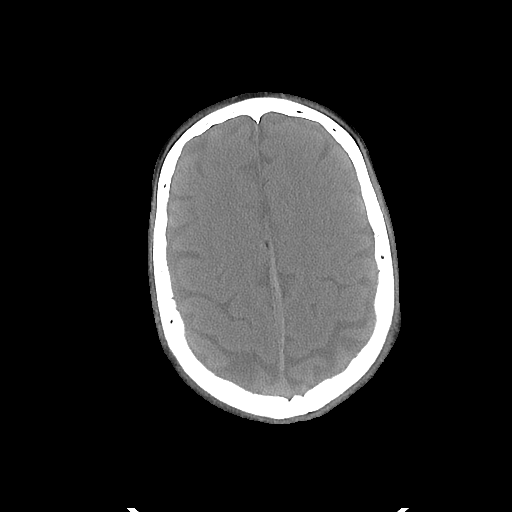
[im 67/87  bone]
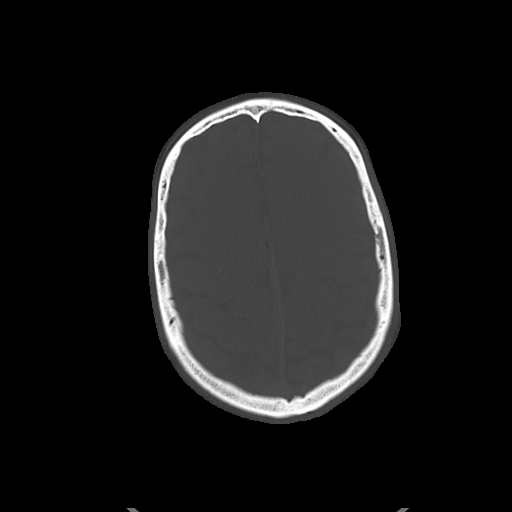
[im 77/87  bone]
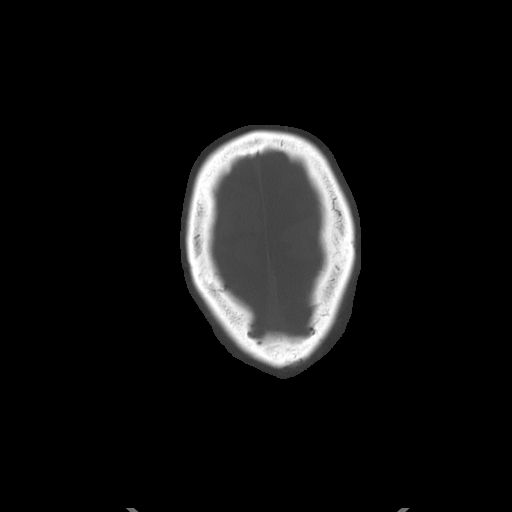

[Series 5: cor soft · coronal · 0.47mm/px · 3 of 76 slices shown]
[im 16/76  bone]
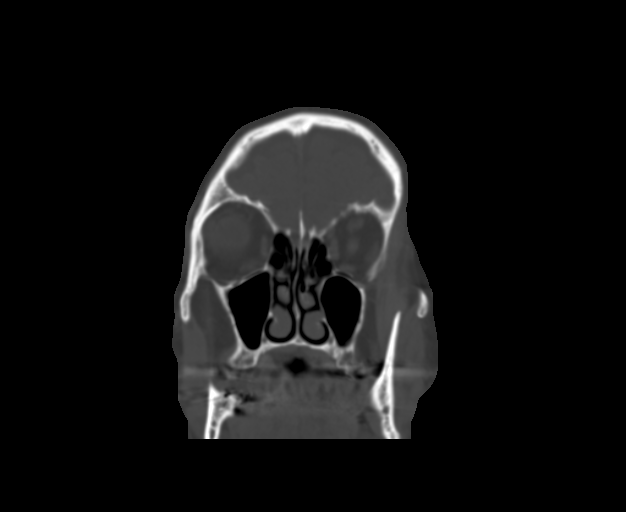
[im 31/76  bone]
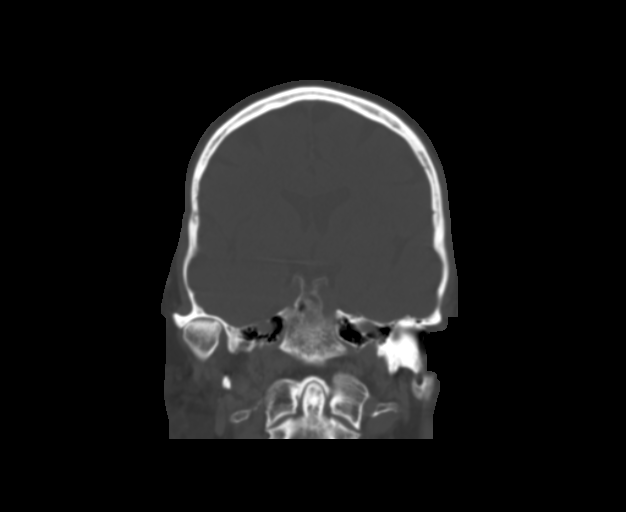
[im 46/76  bone]
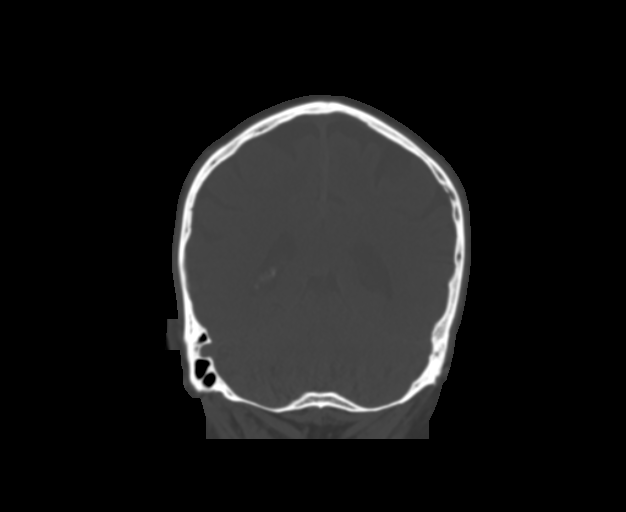

[Series 6: sag soft · sagittal · 0.46mm/px · 5 of 67 slices shown, 6 images]
[im 23/67  bone]
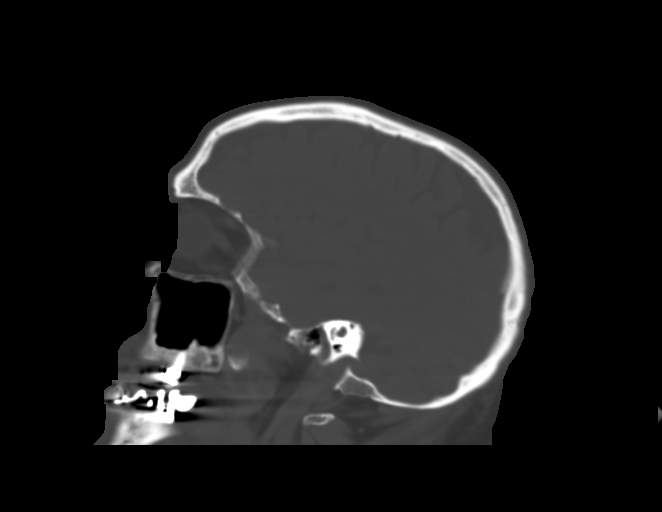
[im 28/67  bone]
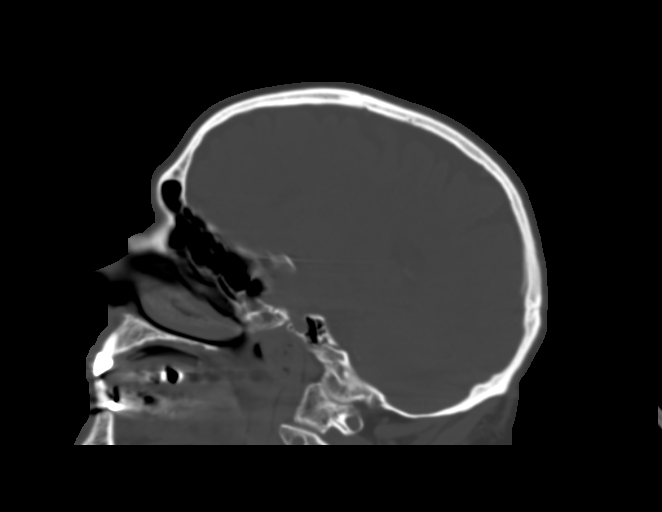
[im 34/67  soft-tissue]
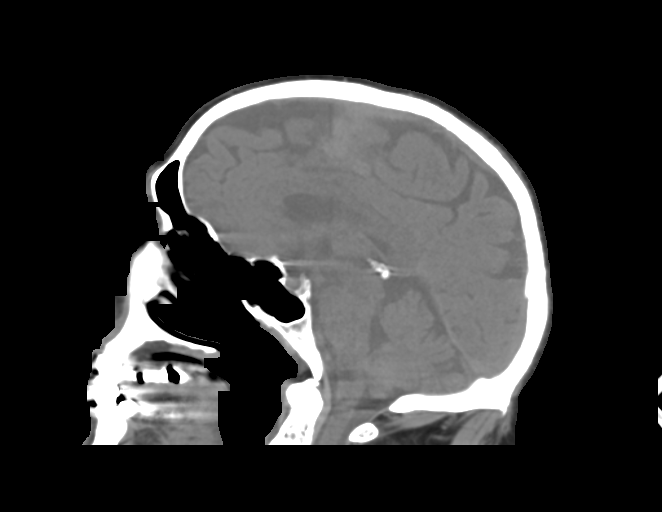
[im 34/67  bone]
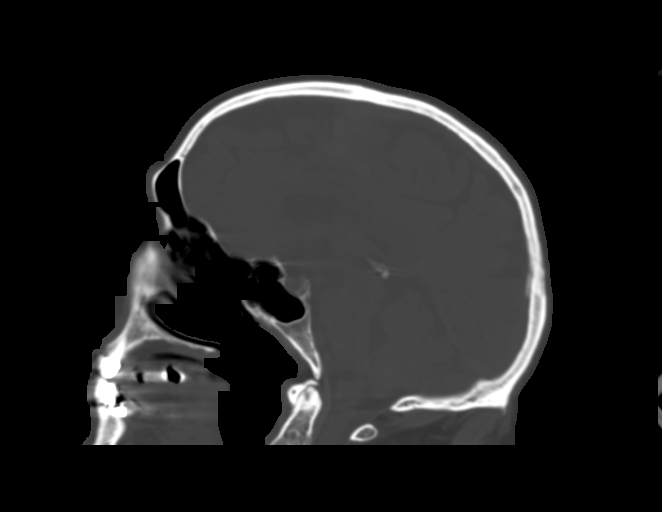
[im 39/67  bone]
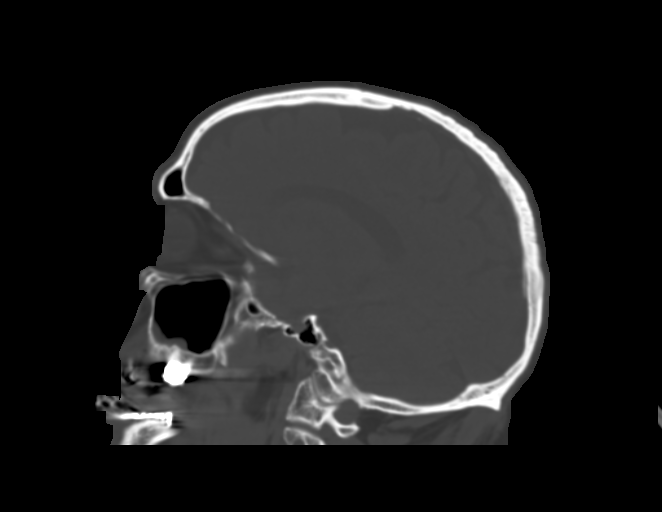
[im 45/67  bone]
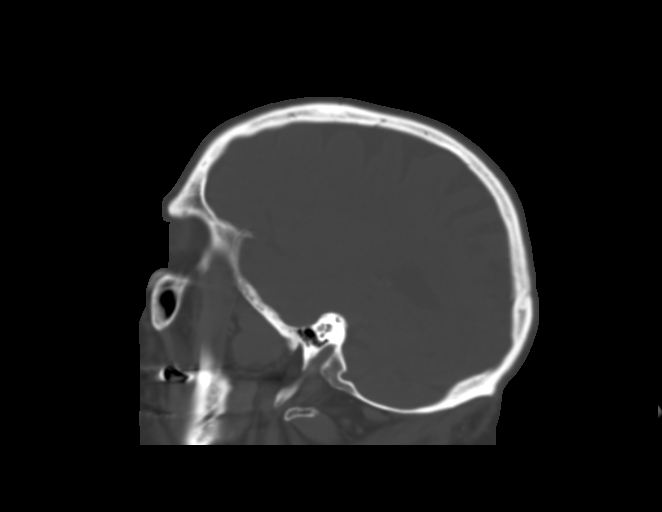

[14 of 33 positions shown; findings below may reference images not displayed]

FINDINGS: CT HEAD FINDINGS

Brain: There is blood noted along the mid to posterior falx in the
midline compatible with small subdural hematoma measuring 5 mm in
thickness. No mass effect or midline shift. Mild cerebral atrophy.
No intraparenchymal hemorrhage. No hydrocephalus.

Vascular: No hyperdense vessel or unexpected calcification.

Skull: No acute calvarial abnormality.

Sinuses/Orbits: No acute findings.

Other: None

CT CERVICAL SPINE FINDINGS

Alignment: No subluxation

Skull base and vertebrae: Slight flattening of the superior endplate
of C7, best seen on sagittal imaging. This may reflect a slight
compression fracture.

Soft tissues and spinal canal: No prevertebral fluid or swelling. No
visible canal hematoma.

Disc levels:  No acute findings

Upper chest: No acute findings

Other: None
IMPRESSION: Small midline hemorrhage along the mid to posterior falx measuring
approximately 5 mm in thickness, likely subdural. No
intraparenchymal hemorrhage. No midline shift or hydrocephalus.

Slight flattening of the superior endplate of C[DATE] reflect a
slight compression fracture deformity.

These results were called by telephone at the time of interpretation
on 05/06/2018 at [DATE] to Dr. SAKETH MARK , who verbally
acknowledged these results.

## 2021-04-10 ENCOUNTER — Other Ambulatory Visit: Payer: Self-pay | Admitting: Physician Assistant

## 2021-05-02 ENCOUNTER — Other Ambulatory Visit: Payer: Self-pay | Admitting: Physician Assistant

## 2021-05-26 ENCOUNTER — Other Ambulatory Visit: Payer: Self-pay | Admitting: Cardiovascular Disease

## 2021-06-04 ENCOUNTER — Other Ambulatory Visit: Payer: Self-pay | Admitting: Cardiovascular Disease

## 2021-06-16 ENCOUNTER — Other Ambulatory Visit: Payer: Self-pay | Admitting: Physician Assistant

## 2021-06-17 ENCOUNTER — Other Ambulatory Visit: Payer: Self-pay | Admitting: Cardiovascular Disease

## 2021-06-27 ENCOUNTER — Other Ambulatory Visit: Payer: Self-pay | Admitting: Cardiovascular Disease

## 2021-06-28 ENCOUNTER — Telehealth: Payer: Self-pay | Admitting: Cardiovascular Disease

## 2021-06-28 ENCOUNTER — Other Ambulatory Visit: Payer: Self-pay

## 2021-06-28 MED ORDER — METOPROLOL TARTRATE 50 MG PO TABS: 50.0000 mg | ORAL_TABLET | Freq: Two times a day (BID) | ORAL | 0 refills | Status: DC

## 2021-06-28 MED ORDER — SIMVASTATIN 40 MG PO TABS: 40.0000 mg | ORAL_TABLET | Freq: Every day | ORAL | 0 refills | Status: DC

## 2021-06-28 NOTE — Telephone Encounter (Signed)
°*  STAT* If patient is at the pharmacy, call can be transferred to refill team.   1. Which medications need to be refilled? (please list name of each medication and dose if known) Metoprolol and Simvastatin  2. Which pharmacy/location (including street and city if local pharmacy) is medication to be sent to? [facility]   [address], [place]  3. Do they need a 30 day or 90 day supply? 90 days and refills

## 2021-07-05 ENCOUNTER — Other Ambulatory Visit: Payer: Self-pay | Admitting: Cardiovascular Disease

## 2021-07-15 ENCOUNTER — Other Ambulatory Visit: Payer: Self-pay | Admitting: Cardiovascular Disease

## 2021-07-18 ENCOUNTER — Other Ambulatory Visit: Payer: Self-pay | Admitting: Cardiovascular Disease

## 2021-07-27 ENCOUNTER — Ambulatory Visit (HOSPITAL_BASED_OUTPATIENT_CLINIC_OR_DEPARTMENT_OTHER): Payer: BC Managed Care – PPO | Admitting: General Practice

## 2021-07-30 ENCOUNTER — Other Ambulatory Visit: Payer: Self-pay | Admitting: Cardiovascular Disease

## 2021-08-01 ENCOUNTER — Ambulatory Visit (INDEPENDENT_AMBULATORY_CARE_PROVIDER_SITE_OTHER): Payer: BC Managed Care – PPO | Admitting: Cardiovascular Disease

## 2021-08-01 ENCOUNTER — Encounter: Payer: Self-pay | Admitting: Cardiovascular Disease

## 2021-08-01 ENCOUNTER — Other Ambulatory Visit: Payer: Self-pay

## 2021-08-01 VITALS — BP 126/78 | HR 51 | Ht 70.0 in | Wt 177.0 lb

## 2021-08-01 DIAGNOSIS — I2510 Atherosclerotic heart disease of native coronary artery without angina pectoris: Secondary | ICD-10-CM | POA: Diagnosis not present

## 2021-08-01 DIAGNOSIS — E785 Hyperlipidemia, unspecified: Secondary | ICD-10-CM

## 2021-08-01 DIAGNOSIS — I10 Essential (primary) hypertension: Secondary | ICD-10-CM | POA: Diagnosis not present

## 2021-08-01 MED ORDER — SIMVASTATIN 40 MG PO TABS: ORAL_TABLET | ORAL | 3 refills | Status: DC

## 2021-08-01 MED ORDER — DILTIAZEM HCL ER COATED BEADS 120 MG PO CP24: 120.0000 mg | ORAL_CAPSULE | Freq: Every day | ORAL | 3 refills | Status: AC

## 2021-08-01 MED ORDER — METOPROLOL TARTRATE 50 MG PO TABS: 50.0000 mg | ORAL_TABLET | Freq: Two times a day (BID) | ORAL | 3 refills | Status: DC

## 2021-08-01 MED ORDER — ISOSORBIDE MONONITRATE ER 30 MG PO TB24: 30.0000 mg | ORAL_TABLET | Freq: Every day | ORAL | 3 refills | Status: DC

## 2021-08-01 NOTE — Progress Notes (Signed)
Chief Complaint  Patient presents with   Follow-up    CAD     History of Present Illness: 76 yo male with history of coronary artery disease and hyperlipidemia here today for cardiac follow up. He had an anterior MI in 1993. His last cardiac catheterization was in 2003 at which time he had total occlusion of the RCA. Nuclear stress test in January 2016 with chronic scar at the apex but no ischemia. LVEF=49%.   He is here today for follow up. The patient denies any chest pain, dyspnea, palpitations, lower extremity edema, orthopnea, PND, dizziness, near syncope or syncope. He works at [employer] and is very active.   Primary Care Physician: Patient, No Pcp Per (Inactive) Kathryne Sharper, [place]  Past Medical History:  Diagnosis Date   Coronary artery disease    MI in 1993, cath 2003 with occluded RCA   Heart attack (HCC) 1993   History of kidney stones    Hyperlipidemia    Hypertension    Subdural hematoma 05/06/2018   large door falling on him and striking him in the head; work related injury    Past Surgical History:  Procedure Laterality Date   APPENDECTOMY     CHOLECYSTECTOMY OPEN     CORONARY ANGIOPLASTY WITH STENT PLACEMENT  1993    Current Outpatient Medications  Medication Sig Dispense Refill   aspirin EC 81 MG tablet Take 81 mg by mouth daily. Swallow whole.     cyclobenzaprine (FLEXERIL) 10 MG tablet Take 1 tablet (10 mg total) by mouth 3 (three) times daily as needed for muscle spasms. 30 tablet 0   nitroGLYCERIN (NITROSTAT) 0.4 MG SL tablet Place 1 tablet (0.4 mg total) under the tongue every 5 (five) minutes as needed for chest pain. 25 tablet 3   diltiazem (CARDIZEM CD) 120 MG 24 hr capsule Take 1 capsule (120 mg total) by mouth daily. 90 capsule 3   isosorbide mononitrate (IMDUR) 30 MG 24 hr tablet Take 1 tablet (30 mg total) by mouth daily. 90 tablet 3   metoprolol tartrate (LOPRESSOR) 50 MG tablet Take 1 tablet (50 mg total) by mouth 2 (two) times daily. 180 tablet 3    simvastatin (ZOCOR) 40 MG tablet TAKE 1 TABLET BY MOUTH DAILY. 90 tablet 3   No current facility-administered medications for this visit.    No Known Allergies  Social History   Socioeconomic History   Marital status: Married    Spouse name: Not on file   Number of children: 2   Years of education: Not on file   Highest education level: Not on file  Occupational History   Occupation: SHIPPING    Employer: [employer]  Tobacco Use   Smoking status: Former    Packs/day: 2.00    Years: 25.00    Pack years: 50.00    Types: Cigarettes    Quit date: 06/11/1991    Years since quitting: 30.1   Smokeless tobacco: Never  Vaping Use   Vaping Use: Never used  Substance and Sexual Activity   Alcohol use: Not Currently    Comment: 05/06/2018 "nothing in the 2000s; never have been a drinker"   Drug use: Never   Sexual activity: Yes  Other Topics Concern   Not on file  Social History Narrative   Not on file   Social Determinants of Health   Financial Resource Strain: Not on file  Food Insecurity: Not on file  Transportation Needs: Not on file  Physical Activity: Not on file  Stress: Not on file  Social Connections: Not on file  Intimate Partner Violence: Not on file    Family History  Problem Relation Age of Onset   Heart attack Father    Heart attack Brother     Review of Systems:  As stated in the HPI and otherwise negative.   BP 126/78    Pulse (!) 51    Ht 5\' 10"  (1.778 m)    Wt 177 lb (80.3 kg)    SpO2 98%    BMI 25.40 kg/m   Physical Examination:  General: Well developed, well nourished, NAD  HEENT: OP clear, mucus membranes moist  SKIN: warm, dry. No rashes. Neuro: No focal deficits  Musculoskeletal: Muscle strength 5/5 all ext  Psychiatric: Mood and affect normal  Neck: No JVD, no carotid bruits, no thyromegaly, no lymphadenopathy.  Lungs:Clear bilaterally, no wheezes, rhonci, crackles Cardiovascular: Regular rate and rhythm. No murmurs, gallops or  rubs. Abdomen:Soft. Bowel sounds present. Non-tender.  Extremities: No lower extremity edema. Pulses are 2 + in the bilateral DP/PT.  EKG:  EKG is ordered today. The ekg ordered today demonstrates sinus brady  Recent Labs: No results found for requested labs within last 8760 hours.   Lipid Panel    Component Value Date/Time   CHOL 105 07/08/2016 0731   TRIG 114 07/08/2016 0731   HDL 37 (L) 07/08/2016 0731   CHOLHDL 2.8 07/08/2016 0731   CHOLHDL 3.1 06/13/2015 0914   VLDL 24 06/13/2015 0914   LDLCALC 45 07/08/2016 0731     Wt Readings from Last 3 Encounters:  08/01/21 177 lb (80.3 kg)  04/19/20 177 lb 6.4 oz (80.5 kg)  04/13/19 176 lb (79.8 kg)     Other studies Reviewed: Additional studies/ records that were reviewed today include: . Review of the above records demonstrates:    Assessment and Plan:   1. Coronary artery disease with stable angina: No chest pain on Imdur. Continue ASA, statin, beta blocker and Imdur.   2. HYPERLIPIDEMIA: Lipids followed at the 13/03/20. Continue statin  3. HTN: BP is controlled. No changes    Current medicines are reviewed at length with the patient today.  The patient does not have concerns regarding medicines.  The following changes have been made:  no change  Labs/ tests ordered today include:   Orders Placed This Encounter  Procedures   EKG 12-Lead     Disposition:   F/U with me in 12  months   Signed, [place], MD 08/01/2021 9:38 AM    [facility] [address] [place], [place], [place]  [place] Phone: [phone]; Fax: [phone]

## 2021-08-01 NOTE — Patient Instructions (Signed)
Medication Instructions:  Your physician recommends that you continue on your current medications as directed. Please refer to the Current Medication list given to you today.  *If you need a refill on your cardiac medications before your next appointment, please call your pharmacy*   Lab Work: NONE If you have labs (blood work) drawn today and your tests are completely normal, you will receive your results only by: MyChart Message (if you have MyChart) OR A paper copy in the mail If you have any lab test that is abnormal or we need to change your treatment, we will call you to review the results.   Testing/Procedures: NONE   Follow-Up: At [facility], you and your health needs are our priority.  As part of our continuing mission to provide you with exceptional heart care, we have created designated Provider Care Teams.  These Care Teams include your primary Cardiologist (physician) and Advanced Practice Providers (APPs -  Physician Assistants and Nurse Practitioners) who all work together to provide you with the care you need, when you need it.  We recommend signing up for the patient portal called "MyChart".  Sign up information is provided on this After Visit Summary.  MyChart is used to connect with patients for Virtual Visits (Telemedicine).  Patients are able to view lab/test results, encounter notes, upcoming appointments, etc.  Non-urgent messages can be sent to your provider as well.   To learn more about what you can do with MyChart, go to [email or web address].    Your next appointment:   1 year(s)  The format for your next appointment:   In Person  Provider:   Christopher McAlhany, MD {   

## 2022-07-28 ENCOUNTER — Other Ambulatory Visit: Payer: Self-pay | Admitting: Cardiovascular Disease

## 2022-08-08 ENCOUNTER — Other Ambulatory Visit: Payer: Self-pay | Admitting: Cardiovascular Disease

## 2022-08-13 ENCOUNTER — Other Ambulatory Visit: Payer: Self-pay | Admitting: Cardiovascular Disease

## 2022-08-21 ENCOUNTER — Other Ambulatory Visit: Payer: Self-pay | Admitting: Cardiovascular Disease
